# Patient Record
Sex: Female | Born: 1935 | Race: Asian | Marital: Married | Smoking: Former smoker
Health system: Southern US, Community
[De-identification: ages and names within clinical notes are randomized; demographics above are authoritative.]

## PROBLEM LIST (undated history)

## (undated) DIAGNOSIS — I509 Heart failure, unspecified: Secondary | ICD-10-CM

## (undated) DIAGNOSIS — I10 Essential (primary) hypertension: Secondary | ICD-10-CM

## (undated) DIAGNOSIS — G8929 Other chronic pain: Secondary | ICD-10-CM

## (undated) DIAGNOSIS — J45909 Unspecified asthma, uncomplicated: Secondary | ICD-10-CM

## (undated) DIAGNOSIS — J309 Allergic rhinitis, unspecified: Secondary | ICD-10-CM

## (undated) DIAGNOSIS — I313 Pericardial effusion (noninflammatory): Secondary | ICD-10-CM

## (undated) DIAGNOSIS — G4730 Sleep apnea, unspecified: Secondary | ICD-10-CM

## (undated) DIAGNOSIS — M810 Age-related osteoporosis without current pathological fracture: Secondary | ICD-10-CM

## (undated) DIAGNOSIS — E785 Hyperlipidemia, unspecified: Secondary | ICD-10-CM

## (undated) HISTORY — DX: Essential (primary) hypertension: I10

## (undated) HISTORY — DX: Sleep apnea, unspecified: G47.30

## (undated) HISTORY — DX: Unspecified asthma, uncomplicated: J45.909

## (undated) HISTORY — DX: Allergic rhinitis, unspecified: J30.9

## (undated) HISTORY — DX: Hyperlipidemia, unspecified: E78.5

## (undated) HISTORY — PX: BACK SURGERY: SHX140

## (undated) HISTORY — DX: Age-related osteoporosis without current pathological fracture: M81.0

---
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
## 2010-10-20 DIAGNOSIS — R5383 Other fatigue: Secondary | ICD-10-CM | POA: Insufficient documentation

## 2010-11-28 DIAGNOSIS — M549 Dorsalgia, unspecified: Secondary | ICD-10-CM | POA: Insufficient documentation

## 2011-07-18 HISTORY — PX: PERICARDIAL WINDOW: SHX2213

## 2012-01-17 HISTORY — PX: CARDIAC SURGERY: SHX584

## 2015-09-23 DIAGNOSIS — R0989 Other specified symptoms and signs involving the circulatory and respiratory systems: Secondary | ICD-10-CM | POA: Insufficient documentation

## 2016-02-07 DIAGNOSIS — R6889 Other general symptoms and signs: Secondary | ICD-10-CM | POA: Diagnosis not present

## 2016-02-07 DIAGNOSIS — M1990 Unspecified osteoarthritis, unspecified site: Secondary | ICD-10-CM | POA: Diagnosis not present

## 2016-02-07 DIAGNOSIS — F5101 Primary insomnia: Secondary | ICD-10-CM | POA: Diagnosis not present

## 2016-02-08 DIAGNOSIS — M5417 Radiculopathy, lumbosacral region: Secondary | ICD-10-CM | POA: Diagnosis not present

## 2016-02-08 DIAGNOSIS — M4807 Spinal stenosis, lumbosacral region: Secondary | ICD-10-CM | POA: Diagnosis not present

## 2016-04-10 DIAGNOSIS — G4700 Insomnia, unspecified: Secondary | ICD-10-CM | POA: Diagnosis not present

## 2016-04-10 DIAGNOSIS — G4733 Obstructive sleep apnea (adult) (pediatric): Secondary | ICD-10-CM | POA: Diagnosis not present

## 2016-05-03 DIAGNOSIS — E785 Hyperlipidemia, unspecified: Secondary | ICD-10-CM | POA: Diagnosis not present

## 2016-05-03 DIAGNOSIS — I10 Essential (primary) hypertension: Secondary | ICD-10-CM | POA: Diagnosis not present

## 2016-05-03 DIAGNOSIS — R0600 Dyspnea, unspecified: Secondary | ICD-10-CM | POA: Diagnosis not present

## 2016-05-08 DIAGNOSIS — E782 Mixed hyperlipidemia: Secondary | ICD-10-CM | POA: Diagnosis not present

## 2016-05-08 DIAGNOSIS — J309 Allergic rhinitis, unspecified: Secondary | ICD-10-CM | POA: Diagnosis not present

## 2016-05-08 DIAGNOSIS — F5101 Primary insomnia: Secondary | ICD-10-CM | POA: Diagnosis not present

## 2016-05-08 DIAGNOSIS — M5030 Other cervical disc degeneration, unspecified cervical region: Secondary | ICD-10-CM | POA: Diagnosis not present

## 2016-05-08 DIAGNOSIS — J45909 Unspecified asthma, uncomplicated: Secondary | ICD-10-CM | POA: Diagnosis not present

## 2016-05-08 DIAGNOSIS — E559 Vitamin D deficiency, unspecified: Secondary | ICD-10-CM | POA: Diagnosis not present

## 2016-05-08 DIAGNOSIS — L299 Pruritus, unspecified: Secondary | ICD-10-CM | POA: Diagnosis not present

## 2016-05-08 DIAGNOSIS — M810 Age-related osteoporosis without current pathological fracture: Secondary | ICD-10-CM | POA: Diagnosis not present

## 2016-05-08 DIAGNOSIS — R351 Nocturia: Secondary | ICD-10-CM | POA: Diagnosis not present

## 2016-05-08 DIAGNOSIS — G4733 Obstructive sleep apnea (adult) (pediatric): Secondary | ICD-10-CM | POA: Diagnosis not present

## 2016-07-11 DIAGNOSIS — E789 Disorder of lipoprotein metabolism, unspecified: Secondary | ICD-10-CM | POA: Diagnosis not present

## 2016-07-11 DIAGNOSIS — I313 Pericardial effusion (noninflammatory): Secondary | ICD-10-CM | POA: Diagnosis not present

## 2016-07-11 DIAGNOSIS — I10 Essential (primary) hypertension: Secondary | ICD-10-CM | POA: Diagnosis not present

## 2016-07-11 DIAGNOSIS — Z6827 Body mass index (BMI) 27.0-27.9, adult: Secondary | ICD-10-CM | POA: Diagnosis not present

## 2016-07-11 DIAGNOSIS — R5382 Chronic fatigue, unspecified: Secondary | ICD-10-CM | POA: Diagnosis not present

## 2016-07-24 DIAGNOSIS — R5383 Other fatigue: Secondary | ICD-10-CM | POA: Diagnosis not present

## 2016-07-24 DIAGNOSIS — I10 Essential (primary) hypertension: Secondary | ICD-10-CM | POA: Diagnosis not present

## 2016-07-24 DIAGNOSIS — I361 Nonrheumatic tricuspid (valve) insufficiency: Secondary | ICD-10-CM | POA: Diagnosis not present

## 2016-10-09 DIAGNOSIS — M79604 Pain in right leg: Secondary | ICD-10-CM | POA: Diagnosis not present

## 2016-10-09 DIAGNOSIS — R609 Edema, unspecified: Secondary | ICD-10-CM | POA: Diagnosis not present

## 2016-10-09 DIAGNOSIS — E785 Hyperlipidemia, unspecified: Secondary | ICD-10-CM | POA: Diagnosis not present

## 2016-10-09 DIAGNOSIS — E663 Overweight: Secondary | ICD-10-CM | POA: Diagnosis not present

## 2016-10-09 DIAGNOSIS — Z131 Encounter for screening for diabetes mellitus: Secondary | ICD-10-CM | POA: Diagnosis not present

## 2016-10-09 DIAGNOSIS — M79672 Pain in left foot: Secondary | ICD-10-CM | POA: Diagnosis not present

## 2016-10-09 DIAGNOSIS — M79605 Pain in left leg: Secondary | ICD-10-CM | POA: Diagnosis not present

## 2016-10-09 DIAGNOSIS — I10 Essential (primary) hypertension: Secondary | ICD-10-CM | POA: Diagnosis not present

## 2016-10-09 DIAGNOSIS — F329 Major depressive disorder, single episode, unspecified: Secondary | ICD-10-CM | POA: Diagnosis not present

## 2016-10-10 ENCOUNTER — Other Ambulatory Visit: Payer: Self-pay | Admitting: Physician Assistant

## 2016-10-10 DIAGNOSIS — R609 Edema, unspecified: Secondary | ICD-10-CM

## 2016-10-10 DIAGNOSIS — M79661 Pain in right lower leg: Secondary | ICD-10-CM

## 2016-10-10 DIAGNOSIS — M79662 Pain in left lower leg: Principal | ICD-10-CM

## 2016-10-18 ENCOUNTER — Ambulatory Visit
Admission: RE | Admit: 2016-10-18 | Discharge: 2016-10-18 | Disposition: A | Discharge status: Home / Self Care | Payer: Medicare Other | Source: Ambulatory Visit | Attending: Physician Assistant | Admitting: Physician Assistant

## 2016-10-18 DIAGNOSIS — M79661 Pain in right lower leg: Secondary | ICD-10-CM

## 2016-10-18 DIAGNOSIS — M7989 Other specified soft tissue disorders: Secondary | ICD-10-CM | POA: Diagnosis not present

## 2016-10-18 DIAGNOSIS — R609 Edema, unspecified: Secondary | ICD-10-CM

## 2016-10-18 DIAGNOSIS — M79662 Pain in left lower leg: Secondary | ICD-10-CM | POA: Diagnosis not present

## 2016-10-25 DIAGNOSIS — E663 Overweight: Secondary | ICD-10-CM | POA: Diagnosis not present

## 2016-10-25 DIAGNOSIS — R7303 Prediabetes: Secondary | ICD-10-CM | POA: Diagnosis not present

## 2016-10-25 DIAGNOSIS — E559 Vitamin D deficiency, unspecified: Secondary | ICD-10-CM | POA: Diagnosis not present

## 2016-10-25 DIAGNOSIS — R05 Cough: Secondary | ICD-10-CM | POA: Diagnosis not present

## 2016-10-25 DIAGNOSIS — F329 Major depressive disorder, single episode, unspecified: Secondary | ICD-10-CM | POA: Diagnosis not present

## 2016-10-25 DIAGNOSIS — E785 Hyperlipidemia, unspecified: Secondary | ICD-10-CM | POA: Diagnosis not present

## 2016-10-25 DIAGNOSIS — I10 Essential (primary) hypertension: Secondary | ICD-10-CM | POA: Diagnosis not present

## 2016-10-25 DIAGNOSIS — R609 Edema, unspecified: Secondary | ICD-10-CM | POA: Diagnosis not present

## 2016-11-03 DIAGNOSIS — R0609 Other forms of dyspnea: Secondary | ICD-10-CM | POA: Diagnosis not present

## 2016-11-03 DIAGNOSIS — I10 Essential (primary) hypertension: Secondary | ICD-10-CM | POA: Diagnosis not present

## 2016-11-11 DIAGNOSIS — Z23 Encounter for immunization: Secondary | ICD-10-CM | POA: Diagnosis not present

## 2016-11-16 DIAGNOSIS — I10 Essential (primary) hypertension: Secondary | ICD-10-CM | POA: Diagnosis not present

## 2016-11-16 DIAGNOSIS — R05 Cough: Secondary | ICD-10-CM | POA: Diagnosis not present

## 2016-11-16 DIAGNOSIS — E782 Mixed hyperlipidemia: Secondary | ICD-10-CM | POA: Diagnosis not present

## 2016-11-16 DIAGNOSIS — J029 Acute pharyngitis, unspecified: Secondary | ICD-10-CM | POA: Diagnosis not present

## 2016-11-16 DIAGNOSIS — J45909 Unspecified asthma, uncomplicated: Secondary | ICD-10-CM | POA: Diagnosis not present

## 2016-11-16 DIAGNOSIS — M810 Age-related osteoporosis without current pathological fracture: Secondary | ICD-10-CM | POA: Diagnosis not present

## 2016-11-20 DIAGNOSIS — M4727 Other spondylosis with radiculopathy, lumbosacral region: Secondary | ICD-10-CM | POA: Diagnosis not present

## 2016-11-21 DIAGNOSIS — R600 Localized edema: Secondary | ICD-10-CM | POA: Diagnosis not present

## 2016-11-21 DIAGNOSIS — E782 Mixed hyperlipidemia: Secondary | ICD-10-CM | POA: Diagnosis not present

## 2016-11-29 DIAGNOSIS — E559 Vitamin D deficiency, unspecified: Secondary | ICD-10-CM | POA: Diagnosis not present

## 2016-11-29 DIAGNOSIS — I10 Essential (primary) hypertension: Secondary | ICD-10-CM | POA: Diagnosis not present

## 2016-11-29 DIAGNOSIS — F329 Major depressive disorder, single episode, unspecified: Secondary | ICD-10-CM | POA: Diagnosis not present

## 2016-11-29 DIAGNOSIS — R609 Edema, unspecified: Secondary | ICD-10-CM | POA: Diagnosis not present

## 2016-11-29 DIAGNOSIS — E663 Overweight: Secondary | ICD-10-CM | POA: Diagnosis not present

## 2016-11-29 DIAGNOSIS — E785 Hyperlipidemia, unspecified: Secondary | ICD-10-CM | POA: Diagnosis not present

## 2016-11-29 DIAGNOSIS — R7303 Prediabetes: Secondary | ICD-10-CM | POA: Diagnosis not present

## 2017-01-26 DIAGNOSIS — R7303 Prediabetes: Secondary | ICD-10-CM | POA: Diagnosis not present

## 2017-01-26 DIAGNOSIS — E663 Overweight: Secondary | ICD-10-CM | POA: Diagnosis not present

## 2017-01-26 DIAGNOSIS — E785 Hyperlipidemia, unspecified: Secondary | ICD-10-CM | POA: Diagnosis not present

## 2017-01-26 DIAGNOSIS — I10 Essential (primary) hypertension: Secondary | ICD-10-CM | POA: Diagnosis not present

## 2017-01-26 DIAGNOSIS — R05 Cough: Secondary | ICD-10-CM | POA: Diagnosis not present

## 2017-01-26 DIAGNOSIS — F329 Major depressive disorder, single episode, unspecified: Secondary | ICD-10-CM | POA: Diagnosis not present

## 2017-01-26 DIAGNOSIS — E559 Vitamin D deficiency, unspecified: Secondary | ICD-10-CM | POA: Diagnosis not present

## 2017-02-20 DIAGNOSIS — R05 Cough: Secondary | ICD-10-CM | POA: Diagnosis not present

## 2017-02-20 DIAGNOSIS — E782 Mixed hyperlipidemia: Secondary | ICD-10-CM | POA: Diagnosis not present

## 2017-02-20 DIAGNOSIS — E559 Vitamin D deficiency, unspecified: Secondary | ICD-10-CM | POA: Diagnosis not present

## 2017-02-20 DIAGNOSIS — J069 Acute upper respiratory infection, unspecified: Secondary | ICD-10-CM | POA: Diagnosis not present

## 2017-02-20 DIAGNOSIS — L299 Pruritus, unspecified: Secondary | ICD-10-CM | POA: Diagnosis not present

## 2017-03-07 DIAGNOSIS — R252 Cramp and spasm: Secondary | ICD-10-CM | POA: Diagnosis not present

## 2017-03-07 DIAGNOSIS — R7303 Prediabetes: Secondary | ICD-10-CM | POA: Diagnosis not present

## 2017-03-07 DIAGNOSIS — E663 Overweight: Secondary | ICD-10-CM | POA: Diagnosis not present

## 2017-03-07 DIAGNOSIS — F329 Major depressive disorder, single episode, unspecified: Secondary | ICD-10-CM | POA: Diagnosis not present

## 2017-03-07 DIAGNOSIS — I10 Essential (primary) hypertension: Secondary | ICD-10-CM | POA: Diagnosis not present

## 2017-03-07 DIAGNOSIS — E785 Hyperlipidemia, unspecified: Secondary | ICD-10-CM | POA: Diagnosis not present

## 2017-03-07 DIAGNOSIS — E559 Vitamin D deficiency, unspecified: Secondary | ICD-10-CM | POA: Diagnosis not present

## 2017-04-12 DIAGNOSIS — G4700 Insomnia, unspecified: Secondary | ICD-10-CM | POA: Diagnosis not present

## 2017-04-12 DIAGNOSIS — G4733 Obstructive sleep apnea (adult) (pediatric): Secondary | ICD-10-CM | POA: Diagnosis not present

## 2017-05-11 DIAGNOSIS — J069 Acute upper respiratory infection, unspecified: Secondary | ICD-10-CM | POA: Diagnosis not present

## 2017-05-11 DIAGNOSIS — R7303 Prediabetes: Secondary | ICD-10-CM | POA: Diagnosis not present

## 2017-05-11 DIAGNOSIS — E785 Hyperlipidemia, unspecified: Secondary | ICD-10-CM | POA: Diagnosis not present

## 2017-05-11 DIAGNOSIS — E559 Vitamin D deficiency, unspecified: Secondary | ICD-10-CM | POA: Diagnosis not present

## 2017-05-11 DIAGNOSIS — F329 Major depressive disorder, single episode, unspecified: Secondary | ICD-10-CM | POA: Diagnosis not present

## 2017-05-11 DIAGNOSIS — R252 Cramp and spasm: Secondary | ICD-10-CM | POA: Diagnosis not present

## 2017-05-11 DIAGNOSIS — I10 Essential (primary) hypertension: Secondary | ICD-10-CM | POA: Diagnosis not present

## 2017-05-11 DIAGNOSIS — E663 Overweight: Secondary | ICD-10-CM | POA: Diagnosis not present

## 2017-05-17 DIAGNOSIS — G4733 Obstructive sleep apnea (adult) (pediatric): Secondary | ICD-10-CM | POA: Diagnosis not present

## 2017-05-17 DIAGNOSIS — E789 Disorder of lipoprotein metabolism, unspecified: Secondary | ICD-10-CM | POA: Diagnosis not present

## 2017-05-17 DIAGNOSIS — R0789 Other chest pain: Secondary | ICD-10-CM | POA: Diagnosis not present

## 2017-05-17 DIAGNOSIS — Z6826 Body mass index (BMI) 26.0-26.9, adult: Secondary | ICD-10-CM | POA: Diagnosis not present

## 2017-05-17 DIAGNOSIS — I10 Essential (primary) hypertension: Secondary | ICD-10-CM | POA: Diagnosis not present

## 2017-05-17 DIAGNOSIS — R5382 Chronic fatigue, unspecified: Secondary | ICD-10-CM | POA: Diagnosis not present

## 2017-05-17 DIAGNOSIS — R05 Cough: Secondary | ICD-10-CM | POA: Diagnosis not present

## 2017-05-17 DIAGNOSIS — R0602 Shortness of breath: Secondary | ICD-10-CM | POA: Diagnosis not present

## 2017-05-18 DIAGNOSIS — G4733 Obstructive sleep apnea (adult) (pediatric): Secondary | ICD-10-CM | POA: Diagnosis not present

## 2017-05-24 DIAGNOSIS — G2581 Restless legs syndrome: Secondary | ICD-10-CM | POA: Diagnosis not present

## 2017-05-24 DIAGNOSIS — J209 Acute bronchitis, unspecified: Secondary | ICD-10-CM | POA: Diagnosis not present

## 2017-05-24 DIAGNOSIS — E782 Mixed hyperlipidemia: Secondary | ICD-10-CM | POA: Diagnosis not present

## 2017-05-24 DIAGNOSIS — E870 Hyperosmolality and hypernatremia: Secondary | ICD-10-CM | POA: Diagnosis not present

## 2017-05-24 DIAGNOSIS — G4700 Insomnia, unspecified: Secondary | ICD-10-CM | POA: Diagnosis not present

## 2017-05-24 DIAGNOSIS — E559 Vitamin D deficiency, unspecified: Secondary | ICD-10-CM | POA: Diagnosis not present

## 2017-05-24 DIAGNOSIS — R8290 Unspecified abnormal findings in urine: Secondary | ICD-10-CM | POA: Diagnosis not present

## 2017-05-24 DIAGNOSIS — R718 Other abnormality of red blood cells: Secondary | ICD-10-CM | POA: Diagnosis not present

## 2017-05-24 DIAGNOSIS — D7589 Other specified diseases of blood and blood-forming organs: Secondary | ICD-10-CM | POA: Diagnosis not present

## 2017-05-24 DIAGNOSIS — I10 Essential (primary) hypertension: Secondary | ICD-10-CM | POA: Diagnosis not present

## 2017-05-30 DIAGNOSIS — G2581 Restless legs syndrome: Secondary | ICD-10-CM | POA: Insufficient documentation

## 2017-06-19 DIAGNOSIS — G4700 Insomnia, unspecified: Secondary | ICD-10-CM | POA: Diagnosis not present

## 2017-06-19 DIAGNOSIS — R5382 Chronic fatigue, unspecified: Secondary | ICD-10-CM | POA: Diagnosis not present

## 2017-06-19 DIAGNOSIS — Z6824 Body mass index (BMI) 24.0-24.9, adult: Secondary | ICD-10-CM | POA: Diagnosis not present

## 2017-06-19 DIAGNOSIS — E789 Disorder of lipoprotein metabolism, unspecified: Secondary | ICD-10-CM | POA: Diagnosis not present

## 2017-06-19 DIAGNOSIS — I10 Essential (primary) hypertension: Secondary | ICD-10-CM | POA: Diagnosis not present

## 2017-09-03 DIAGNOSIS — E782 Mixed hyperlipidemia: Secondary | ICD-10-CM | POA: Diagnosis not present

## 2017-09-03 DIAGNOSIS — E559 Vitamin D deficiency, unspecified: Secondary | ICD-10-CM | POA: Diagnosis not present

## 2017-09-03 DIAGNOSIS — G4733 Obstructive sleep apnea (adult) (pediatric): Secondary | ICD-10-CM | POA: Diagnosis not present

## 2017-09-03 DIAGNOSIS — I10 Essential (primary) hypertension: Secondary | ICD-10-CM | POA: Diagnosis not present

## 2017-09-03 DIAGNOSIS — R252 Cramp and spasm: Secondary | ICD-10-CM | POA: Diagnosis not present

## 2017-09-03 DIAGNOSIS — J4520 Mild intermittent asthma, uncomplicated: Secondary | ICD-10-CM | POA: Diagnosis not present

## 2017-09-03 DIAGNOSIS — G4709 Other insomnia: Secondary | ICD-10-CM | POA: Diagnosis not present

## 2017-09-11 ENCOUNTER — Other Ambulatory Visit: Payer: Self-pay | Admitting: Internal Medicine

## 2017-09-11 DIAGNOSIS — E2839 Other primary ovarian failure: Secondary | ICD-10-CM

## 2017-09-12 DIAGNOSIS — E785 Hyperlipidemia, unspecified: Secondary | ICD-10-CM | POA: Insufficient documentation

## 2017-09-12 DIAGNOSIS — L299 Pruritus, unspecified: Secondary | ICD-10-CM | POA: Insufficient documentation

## 2017-09-12 DIAGNOSIS — F5101 Primary insomnia: Secondary | ICD-10-CM | POA: Insufficient documentation

## 2017-09-12 DIAGNOSIS — R351 Nocturia: Secondary | ICD-10-CM | POA: Insufficient documentation

## 2017-09-12 DIAGNOSIS — M5030 Other cervical disc degeneration, unspecified cervical region: Secondary | ICD-10-CM | POA: Insufficient documentation

## 2017-09-12 DIAGNOSIS — E559 Vitamin D deficiency, unspecified: Secondary | ICD-10-CM | POA: Insufficient documentation

## 2017-09-12 DIAGNOSIS — G4733 Obstructive sleep apnea (adult) (pediatric): Secondary | ICD-10-CM | POA: Insufficient documentation

## 2017-09-12 DIAGNOSIS — J309 Allergic rhinitis, unspecified: Secondary | ICD-10-CM | POA: Insufficient documentation

## 2017-09-12 DIAGNOSIS — M810 Age-related osteoporosis without current pathological fracture: Secondary | ICD-10-CM | POA: Insufficient documentation

## 2017-09-12 DIAGNOSIS — J45909 Unspecified asthma, uncomplicated: Secondary | ICD-10-CM | POA: Insufficient documentation

## 2017-10-09 DIAGNOSIS — G2581 Restless legs syndrome: Secondary | ICD-10-CM | POA: Diagnosis not present

## 2017-10-09 DIAGNOSIS — R252 Cramp and spasm: Secondary | ICD-10-CM | POA: Diagnosis not present

## 2017-10-09 DIAGNOSIS — Z0001 Encounter for general adult medical examination with abnormal findings: Secondary | ICD-10-CM | POA: Diagnosis not present

## 2017-10-09 DIAGNOSIS — E559 Vitamin D deficiency, unspecified: Secondary | ICD-10-CM | POA: Diagnosis not present

## 2017-10-09 DIAGNOSIS — E782 Mixed hyperlipidemia: Secondary | ICD-10-CM | POA: Diagnosis not present

## 2017-10-15 DIAGNOSIS — N183 Chronic kidney disease, stage 3 unspecified: Secondary | ICD-10-CM | POA: Insufficient documentation

## 2017-10-15 DIAGNOSIS — D7589 Other specified diseases of blood and blood-forming organs: Secondary | ICD-10-CM | POA: Diagnosis not present

## 2017-10-15 DIAGNOSIS — Z13820 Encounter for screening for osteoporosis: Secondary | ICD-10-CM | POA: Diagnosis not present

## 2017-10-15 DIAGNOSIS — E782 Mixed hyperlipidemia: Secondary | ICD-10-CM | POA: Diagnosis not present

## 2017-10-15 DIAGNOSIS — Z0001 Encounter for general adult medical examination with abnormal findings: Secondary | ICD-10-CM | POA: Diagnosis not present

## 2017-10-15 DIAGNOSIS — I10 Essential (primary) hypertension: Secondary | ICD-10-CM | POA: Diagnosis not present

## 2017-10-15 DIAGNOSIS — E559 Vitamin D deficiency, unspecified: Secondary | ICD-10-CM | POA: Diagnosis not present

## 2017-10-15 DIAGNOSIS — R252 Cramp and spasm: Secondary | ICD-10-CM | POA: Diagnosis not present

## 2017-10-15 DIAGNOSIS — Z23 Encounter for immunization: Secondary | ICD-10-CM | POA: Diagnosis not present

## 2017-10-15 DIAGNOSIS — Z1239 Encounter for other screening for malignant neoplasm of breast: Secondary | ICD-10-CM | POA: Diagnosis not present

## 2017-10-15 DIAGNOSIS — N289 Disorder of kidney and ureter, unspecified: Secondary | ICD-10-CM | POA: Diagnosis not present

## 2017-10-18 ENCOUNTER — Other Ambulatory Visit: Payer: Self-pay | Admitting: Internal Medicine

## 2017-10-18 DIAGNOSIS — Z1231 Encounter for screening mammogram for malignant neoplasm of breast: Secondary | ICD-10-CM

## 2017-10-18 DIAGNOSIS — Z1239 Encounter for other screening for malignant neoplasm of breast: Secondary | ICD-10-CM

## 2017-11-06 ENCOUNTER — Inpatient Hospital Stay
Admission: RE | Admit: 2017-11-06 | Discharge: 2017-11-06 | Disposition: A | Discharge status: Home / Self Care | Payer: Medicare Other | Source: Ambulatory Visit | Attending: Internal Medicine | Admitting: Internal Medicine

## 2017-12-27 DIAGNOSIS — M62838 Other muscle spasm: Secondary | ICD-10-CM | POA: Diagnosis not present

## 2017-12-27 DIAGNOSIS — Z6825 Body mass index (BMI) 25.0-25.9, adult: Secondary | ICD-10-CM | POA: Diagnosis not present

## 2017-12-27 DIAGNOSIS — I10 Essential (primary) hypertension: Secondary | ICD-10-CM | POA: Diagnosis not present

## 2017-12-27 DIAGNOSIS — E789 Disorder of lipoprotein metabolism, unspecified: Secondary | ICD-10-CM | POA: Diagnosis not present

## 2017-12-27 DIAGNOSIS — R5382 Chronic fatigue, unspecified: Secondary | ICD-10-CM | POA: Diagnosis not present

## 2017-12-31 DIAGNOSIS — J158 Pneumonia due to other specified bacteria: Secondary | ICD-10-CM | POA: Diagnosis not present

## 2018-01-14 ENCOUNTER — Other Ambulatory Visit: Payer: Self-pay | Admitting: Internal Medicine

## 2018-01-14 ENCOUNTER — Ambulatory Visit
Admission: RE | Admit: 2018-01-14 | Discharge: 2018-01-14 | Disposition: A | Discharge status: Home / Self Care | Payer: Medicare Other | Source: Ambulatory Visit | Attending: Internal Medicine | Admitting: Internal Medicine

## 2018-01-14 DIAGNOSIS — G4709 Other insomnia: Secondary | ICD-10-CM | POA: Diagnosis not present

## 2018-01-14 DIAGNOSIS — R05 Cough: Secondary | ICD-10-CM

## 2018-01-14 DIAGNOSIS — R252 Cramp and spasm: Secondary | ICD-10-CM | POA: Diagnosis not present

## 2018-01-14 DIAGNOSIS — R059 Cough, unspecified: Secondary | ICD-10-CM

## 2018-01-14 DIAGNOSIS — I10 Essential (primary) hypertension: Secondary | ICD-10-CM | POA: Diagnosis not present

## 2018-01-14 DIAGNOSIS — R0989 Other specified symptoms and signs involving the circulatory and respiratory systems: Secondary | ICD-10-CM | POA: Diagnosis not present

## 2018-02-18 DIAGNOSIS — R05 Cough: Secondary | ICD-10-CM | POA: Diagnosis not present

## 2018-02-18 DIAGNOSIS — N183 Chronic kidney disease, stage 3 (moderate): Secondary | ICD-10-CM | POA: Diagnosis not present

## 2018-02-18 DIAGNOSIS — R252 Cramp and spasm: Secondary | ICD-10-CM | POA: Diagnosis not present

## 2018-02-18 DIAGNOSIS — G4709 Other insomnia: Secondary | ICD-10-CM | POA: Diagnosis not present

## 2018-02-18 DIAGNOSIS — M545 Low back pain: Secondary | ICD-10-CM | POA: Diagnosis not present

## 2018-05-20 DIAGNOSIS — I3139 Other pericardial effusion (noninflammatory): Secondary | ICD-10-CM

## 2018-05-20 DIAGNOSIS — E559 Vitamin D deficiency, unspecified: Secondary | ICD-10-CM | POA: Diagnosis not present

## 2018-05-20 DIAGNOSIS — J309 Allergic rhinitis, unspecified: Secondary | ICD-10-CM | POA: Diagnosis not present

## 2018-05-20 DIAGNOSIS — I313 Pericardial effusion (noninflammatory): Secondary | ICD-10-CM

## 2018-05-20 DIAGNOSIS — J45909 Unspecified asthma, uncomplicated: Secondary | ICD-10-CM | POA: Diagnosis not present

## 2018-05-20 DIAGNOSIS — G4733 Obstructive sleep apnea (adult) (pediatric): Secondary | ICD-10-CM | POA: Diagnosis not present

## 2018-05-20 DIAGNOSIS — F5101 Primary insomnia: Secondary | ICD-10-CM | POA: Diagnosis not present

## 2018-05-20 DIAGNOSIS — I10 Essential (primary) hypertension: Secondary | ICD-10-CM | POA: Diagnosis not present

## 2018-05-20 DIAGNOSIS — E782 Mixed hyperlipidemia: Secondary | ICD-10-CM | POA: Diagnosis not present

## 2018-05-20 DIAGNOSIS — Z1239 Encounter for other screening for malignant neoplasm of breast: Secondary | ICD-10-CM | POA: Diagnosis not present

## 2018-05-20 DIAGNOSIS — N183 Chronic kidney disease, stage 3 (moderate): Secondary | ICD-10-CM | POA: Diagnosis not present

## 2018-05-20 DIAGNOSIS — M810 Age-related osteoporosis without current pathological fracture: Secondary | ICD-10-CM | POA: Diagnosis not present

## 2018-05-20 HISTORY — DX: Pericardial effusion (noninflammatory): I31.3

## 2018-05-20 HISTORY — DX: Other pericardial effusion (noninflammatory): I31.39

## 2018-05-27 ENCOUNTER — Other Ambulatory Visit: Payer: Self-pay | Admitting: Internal Medicine

## 2018-05-27 DIAGNOSIS — M810 Age-related osteoporosis without current pathological fracture: Secondary | ICD-10-CM

## 2018-06-20 DIAGNOSIS — R05 Cough: Secondary | ICD-10-CM | POA: Diagnosis not present

## 2018-06-20 DIAGNOSIS — F5104 Psychophysiologic insomnia: Secondary | ICD-10-CM | POA: Diagnosis not present

## 2018-06-20 DIAGNOSIS — J209 Acute bronchitis, unspecified: Secondary | ICD-10-CM | POA: Diagnosis not present

## 2018-06-24 ENCOUNTER — Other Ambulatory Visit: Payer: Self-pay

## 2018-06-24 ENCOUNTER — Ambulatory Visit (INDEPENDENT_AMBULATORY_CARE_PROVIDER_SITE_OTHER): Payer: Medicare Other | Admitting: Internal Medicine

## 2018-06-24 ENCOUNTER — Encounter: Payer: Self-pay | Admitting: Internal Medicine

## 2018-06-24 VITALS — BP 112/60 | HR 79 | Temp 97.8°F | Ht 69.0 in | Wt 135.0 lb

## 2018-06-24 DIAGNOSIS — I10 Essential (primary) hypertension: Secondary | ICD-10-CM | POA: Insufficient documentation

## 2018-06-24 DIAGNOSIS — G2581 Restless legs syndrome: Secondary | ICD-10-CM | POA: Diagnosis not present

## 2018-06-24 DIAGNOSIS — G4733 Obstructive sleep apnea (adult) (pediatric): Secondary | ICD-10-CM

## 2018-06-24 MED ORDER — DOXEPIN HCL 10 MG PO CAPS: ORAL_CAPSULE | ORAL | 5 refills | Status: AC

## 2018-06-24 NOTE — Patient Instructions (Signed)
Script sent refilling doxepin 10 mg for sleep  We will contact your previous sleep doctors for the results of the first sleep test.  We will arrange for a local home care company to help take care of your CPAP machine. They will be able to provide new masks, hoses, filters and other supplies as needed.   Please contact us if we can help

## 2018-06-24 NOTE — Assessment & Plan Note (Signed)
Mild OSA.  She seems to benefit from CPAP and to be compliant, though we don't have a download. Using fixed pressure AirSense 10. Plan- we will establish her with a local DME for supplies and support, then obtain download.

## 2018-06-24 NOTE — Progress Notes (Signed)
06/24/2018- 7 yoF for sleep evaluation, [place]- son is interpreter- referred by PCP Dr Maia Petties, OSA on CPAP, uses every night   Medical problem list includes pericardial effusion, HBP, OSA, Restlesss Legs, Nocturia, Asthma, allergic rhinitis, osteoporosis, DDD, CKD3, Pruritus Communication limited- son helpful but his English not good. Advise interpreter in future. She has been successfully using CPAP for about 5 years. Had update NPSG in [place] in 2019  NPSG 05/17/17 ( filed in [employer]) AHI 5.3/ hr, PLMs 236.5/ hr w arousals, desaturation to 80% w average sat 94%. CPAP 7 was recommended, body weight 137 lbs. CPAP was apparently provided through the sleep program associated with a pulmonary office in [place] and she needs a local DME established.  Son indicates she is happy with her CPAP. Using an AirSense 10 machine. She asks refill Doxepin 10 mg, used for sleep and leg cramps. Also has cyclobenzaprine.  Prior to Admission medications   Medication Sig Start Date End Date Taking? Authorizing Provider  albuterol (PROAIR HFA) 108 (90 Base) MCG/ACT inhaler ProAir HFA 90 mcg/actuation aerosol inhaler  Inhale 2 puffs every 4 hours by inhalation route as needed. 05/14/14  Yes [provider]  atorvastatin (LIPITOR) 80 MG tablet atorvastatin 80 mg tablet  use one-half daily in the evenings. 06/19/17  Yes [provider]  cholecalciferol (VITAMIN D3) 25 MCG (1000 UT) tablet Take 1,000 Units by mouth daily.   Yes [provider]  cyclobenzaprine (FLEXERIL) 5 MG tablet cyclobenzaprine 5 mg tablet  Take 1.5 tablets as needed by oral route at bedtime for 30 days.  for muscle cramps ; may use up to 2 x a day as needed   Yes [provider]  doxepin (SINEQUAN) 10 MG capsule 1 at bedtime for leg cramps and sleep 06/24/18  Yes Baird Lyons D, MD  fluticasone [facility]) 50 MCG/ACT nasal spray fluticasone propionate 50 mcg/actuation nasal spray,suspension  as needed for seasonal  allergies 05/14/14  Yes [provider]  losartan (COZAAR) 25 MG tablet losartan 25 mg tablet  Take 1 tablet every day by oral route for 30 days.   Yes [provider]   Past Medical History:  Diagnosis Date  . Allergic rhinitis   . Asthma   . Hyperlipidemia   . Hypertension   . Osteoporosis   . Pericardial effusion 05/20/2018  . Sleep apnea    Past Surgical History:  Procedure Laterality Date  . CARDIAC SURGERY  2014   Unknown type   History reviewed. No pertinent family history. Social History   Socioeconomic History  . Marital status: Unknown    Spouse name: Not on file  . Number of children: Not on file  . Years of education: Not on file  . Highest education level: Not on file  Occupational History  . Not on file  Social Needs  . Financial resource strain: Not on file  . Food insecurity:    Worry: Not on file    Inability: Not on file  . Transportation needs:    Medical: Not on file    Non-medical: Not on file  Tobacco Use  . Smoking status: Never Smoker  . Smokeless tobacco: Never Used  Substance and Sexual Activity  . Alcohol use: Not on file  . Drug use: Not on file  . Sexual activity: Not on file  Lifestyle  . Physical activity:    Days per week: Not on file    Minutes per session: Not on file  . Stress: Not on file  Relationships  . Social connections:    Talks on phone: Not on file    Gets together: Not on file    Attends religious service: Not on file    Active member of club or organization: Not on file    Attends meetings of clubs or organizations: Not on file    Relationship status: Not on file  . Intimate partner violence:    Fear of current or ex partner: Not on file    Emotionally abused: Not on file    Physically abused: Not on file    Forced sexual activity: Not on file  Other Topics Concern  . Not on file  Social History Narrative  . Not on file   ROS-see HPI   + = positive Constitutional:    weight loss, night  sweats, fevers, chills, fatigue, lassitude. HEENT:    headaches, difficulty swallowing, tooth/dental problems, sore throat,       sneezing, itching, ear ache, nasal congestion, post nasal drip, snoring CV:    chest pain, orthopnea, PND, swelling in lower extremities, anasarca,                                  dizziness, palpitations Resp:   shortness of breath with exertion or at rest.                productive cough,   non-productive cough, coughing up of blood.              change in color of mucus.  wheezing.   Skin:    rash or lesions. GI:  No-   heartburn, indigestion, abdominal pain, nausea, vomiting, diarrhea,                 change in bowel habits, loss of appetite GU: dysuria, change in color of urine, no urgency or frequency.   flank pain. MS:   joint pain, stiffness, decreased range of motion, back pain. Neuro-     nothing unusual Psych:  change in mood or affect.  depression or anxiety.   memory loss.  OBJ- Physical Exam General- Alert, Oriented, Affect-appropriate, Distress- none acute, not obese Skin- rash-none, lesions- none, excoriation- none Lymphadenopathy- none Head- atraumatic            Eyes- Gross vision intact, PERRLA, conjunctivae and secretions clear            Ears- Hearing, canals-normal            Nose- Clear, no-Septal dev, mucus, polyps, erosion, perforation             Throat- Mallampati II , mucosa clear , drainage- none, tonsils- atrophic, + some missing teeth Neck- flexible , trachea midline, no stridor , thyroid nl, carotid no bruit Chest - symmetrical excursion , unlabored           Heart/CV- RRR , no murmur , no gallop  , no rub, nl s1 s2                           - JVD- none , edema- none, stasis changes- none, varices- none           Lung- clear to P&A, wheeze- none, cough- none , dullness-none, rub- none           Chest wall-  Abd-  Br/ Gen/ Rectal- Not done, not indicated Extrem- cyanosis- none,  clubbing, none, atrophy- none, strength-  nl Neuro- grossly intact to observation

## 2018-06-24 NOTE — Assessment & Plan Note (Signed)
Limb movement sleep disturbance seems to be a bigger problem for her than sleep apnea. Apparently Doxepin does help and she requests. Carry over sedation is not reported to be a problem. Plan- refill e-sent for Doxepin 10 mg for sleep and leg cramps

## 2018-07-23 DIAGNOSIS — R5382 Chronic fatigue, unspecified: Secondary | ICD-10-CM | POA: Diagnosis not present

## 2018-07-23 DIAGNOSIS — I10 Essential (primary) hypertension: Secondary | ICD-10-CM | POA: Diagnosis not present

## 2018-07-23 DIAGNOSIS — G4700 Insomnia, unspecified: Secondary | ICD-10-CM | POA: Diagnosis not present

## 2018-07-23 DIAGNOSIS — E789 Disorder of lipoprotein metabolism, unspecified: Secondary | ICD-10-CM | POA: Diagnosis not present

## 2018-07-23 DIAGNOSIS — R05 Cough: Secondary | ICD-10-CM | POA: Diagnosis not present

## 2018-08-02 DIAGNOSIS — F5101 Primary insomnia: Secondary | ICD-10-CM | POA: Diagnosis not present

## 2018-08-02 DIAGNOSIS — G4709 Other insomnia: Secondary | ICD-10-CM | POA: Diagnosis not present

## 2018-08-02 DIAGNOSIS — R05 Cough: Secondary | ICD-10-CM | POA: Diagnosis not present

## 2018-08-02 DIAGNOSIS — G2581 Restless legs syndrome: Secondary | ICD-10-CM | POA: Diagnosis not present

## 2018-08-02 DIAGNOSIS — N183 Chronic kidney disease, stage 3 (moderate): Secondary | ICD-10-CM | POA: Diagnosis not present

## 2018-08-02 DIAGNOSIS — I10 Essential (primary) hypertension: Secondary | ICD-10-CM | POA: Diagnosis not present

## 2018-08-02 DIAGNOSIS — R252 Cramp and spasm: Secondary | ICD-10-CM | POA: Diagnosis not present

## 2018-08-02 DIAGNOSIS — J45909 Unspecified asthma, uncomplicated: Secondary | ICD-10-CM | POA: Diagnosis not present

## 2018-08-02 DIAGNOSIS — E782 Mixed hyperlipidemia: Secondary | ICD-10-CM | POA: Diagnosis not present

## 2018-08-02 DIAGNOSIS — J309 Allergic rhinitis, unspecified: Secondary | ICD-10-CM | POA: Diagnosis not present

## 2018-08-02 DIAGNOSIS — G4733 Obstructive sleep apnea (adult) (pediatric): Secondary | ICD-10-CM | POA: Diagnosis not present

## 2018-08-02 DIAGNOSIS — E559 Vitamin D deficiency, unspecified: Secondary | ICD-10-CM | POA: Diagnosis not present

## 2018-08-16 ENCOUNTER — Other Ambulatory Visit: Payer: Self-pay | Admitting: Internal Medicine

## 2018-08-16 DIAGNOSIS — E2839 Other primary ovarian failure: Secondary | ICD-10-CM

## 2018-08-16 DIAGNOSIS — M810 Age-related osteoporosis without current pathological fracture: Secondary | ICD-10-CM

## 2018-08-21 ENCOUNTER — Ambulatory Visit: Payer: Medicare Other

## 2018-08-21 ENCOUNTER — Other Ambulatory Visit: Payer: Medicare Other

## 2018-09-26 DIAGNOSIS — E559 Vitamin D deficiency, unspecified: Secondary | ICD-10-CM | POA: Diagnosis not present

## 2018-09-26 DIAGNOSIS — N183 Chronic kidney disease, stage 3 (moderate): Secondary | ICD-10-CM | POA: Diagnosis not present

## 2018-09-26 DIAGNOSIS — I10 Essential (primary) hypertension: Secondary | ICD-10-CM | POA: Diagnosis not present

## 2018-09-26 DIAGNOSIS — Z131 Encounter for screening for diabetes mellitus: Secondary | ICD-10-CM | POA: Diagnosis not present

## 2018-09-26 DIAGNOSIS — E782 Mixed hyperlipidemia: Secondary | ICD-10-CM | POA: Diagnosis not present

## 2018-09-27 ENCOUNTER — Inpatient Hospital Stay (HOSPITAL_COMMUNITY)
Admission: EM | Admit: 2018-09-27 | Discharge: 2018-09-30 | DRG: 184 | Disposition: A | Discharge status: Home Health | Payer: Medicare Other | Attending: General Surgery | Admitting: General Surgery

## 2018-09-27 ENCOUNTER — Other Ambulatory Visit: Payer: Self-pay

## 2018-09-27 ENCOUNTER — Inpatient Hospital Stay (HOSPITAL_COMMUNITY): Payer: Medicare Other

## 2018-09-27 ENCOUNTER — Emergency Department (HOSPITAL_COMMUNITY): Payer: Medicare Other

## 2018-09-27 ENCOUNTER — Encounter (HOSPITAL_COMMUNITY): Payer: Self-pay | Admitting: Emergency Medicine

## 2018-09-27 DIAGNOSIS — W19XXXA Unspecified fall, initial encounter: Secondary | ICD-10-CM | POA: Diagnosis not present

## 2018-09-27 DIAGNOSIS — W1809XA Striking against other object with subsequent fall, initial encounter: Secondary | ICD-10-CM | POA: Diagnosis present

## 2018-09-27 DIAGNOSIS — Z79899 Other long term (current) drug therapy: Secondary | ICD-10-CM | POA: Diagnosis not present

## 2018-09-27 DIAGNOSIS — N179 Acute kidney failure, unspecified: Secondary | ICD-10-CM | POA: Diagnosis present

## 2018-09-27 DIAGNOSIS — R160 Hepatomegaly, not elsewhere classified: Secondary | ICD-10-CM | POA: Diagnosis present

## 2018-09-27 DIAGNOSIS — M5030 Other cervical disc degeneration, unspecified cervical region: Secondary | ICD-10-CM | POA: Diagnosis present

## 2018-09-27 DIAGNOSIS — J309 Allergic rhinitis, unspecified: Secondary | ICD-10-CM | POA: Diagnosis present

## 2018-09-27 DIAGNOSIS — Z03818 Encounter for observation for suspected exposure to other biological agents ruled out: Secondary | ICD-10-CM | POA: Diagnosis not present

## 2018-09-27 DIAGNOSIS — M810 Age-related osteoporosis without current pathological fracture: Secondary | ICD-10-CM | POA: Diagnosis present

## 2018-09-27 DIAGNOSIS — Z20828 Contact with and (suspected) exposure to other viral communicable diseases: Secondary | ICD-10-CM | POA: Diagnosis present

## 2018-09-27 DIAGNOSIS — S2241XD Multiple fractures of ribs, right side, subsequent encounter for fracture with routine healing: Secondary | ICD-10-CM | POA: Diagnosis not present

## 2018-09-27 DIAGNOSIS — I129 Hypertensive chronic kidney disease with stage 1 through stage 4 chronic kidney disease, or unspecified chronic kidney disease: Secondary | ICD-10-CM | POA: Diagnosis present

## 2018-09-27 DIAGNOSIS — E785 Hyperlipidemia, unspecified: Secondary | ICD-10-CM | POA: Diagnosis present

## 2018-09-27 DIAGNOSIS — I10 Essential (primary) hypertension: Secondary | ICD-10-CM | POA: Diagnosis not present

## 2018-09-27 DIAGNOSIS — K828 Other specified diseases of gallbladder: Secondary | ICD-10-CM | POA: Diagnosis not present

## 2018-09-27 DIAGNOSIS — R52 Pain, unspecified: Secondary | ICD-10-CM | POA: Diagnosis not present

## 2018-09-27 DIAGNOSIS — S2239XA Fracture of one rib, unspecified side, initial encounter for closed fracture: Secondary | ICD-10-CM | POA: Diagnosis not present

## 2018-09-27 DIAGNOSIS — Z08 Encounter for follow-up examination after completed treatment for malignant neoplasm: Secondary | ICD-10-CM

## 2018-09-27 DIAGNOSIS — S3991XA Unspecified injury of abdomen, initial encounter: Secondary | ICD-10-CM | POA: Diagnosis not present

## 2018-09-27 DIAGNOSIS — G4733 Obstructive sleep apnea (adult) (pediatric): Secondary | ICD-10-CM | POA: Diagnosis present

## 2018-09-27 DIAGNOSIS — R109 Unspecified abdominal pain: Secondary | ICD-10-CM | POA: Diagnosis not present

## 2018-09-27 DIAGNOSIS — K829 Disease of gallbladder, unspecified: Secondary | ICD-10-CM | POA: Diagnosis present

## 2018-09-27 DIAGNOSIS — N183 Chronic kidney disease, stage 3 (moderate): Secondary | ICD-10-CM | POA: Diagnosis present

## 2018-09-27 DIAGNOSIS — S2241XA Multiple fractures of ribs, right side, initial encounter for closed fracture: Principal | ICD-10-CM | POA: Diagnosis present

## 2018-09-27 DIAGNOSIS — J90 Pleural effusion, not elsewhere classified: Secondary | ICD-10-CM | POA: Diagnosis not present

## 2018-09-27 DIAGNOSIS — M25511 Pain in right shoulder: Secondary | ICD-10-CM | POA: Diagnosis not present

## 2018-09-27 DIAGNOSIS — M5489 Other dorsalgia: Secondary | ICD-10-CM | POA: Diagnosis not present

## 2018-09-27 HISTORY — DX: Other chronic pain: G89.29

## 2018-09-27 HISTORY — DX: Heart failure, unspecified: I50.9

## 2018-09-27 LAB — CBC
HCT: 38.4 % (ref 36.0–46.0)
Hemoglobin: 12.4 g/dL (ref 12.0–15.0)
MCH: 31.1 pg (ref 26.0–34.0)
MCHC: 32.3 g/dL (ref 30.0–36.0)
MCV: 96.2 fL (ref 80.0–100.0)
Platelets: 243 10*3/uL (ref 150–400)
RBC: 3.99 MIL/uL (ref 3.87–5.11)
RDW: 14.1 % (ref 11.5–15.5)
WBC: 10.4 10*3/uL (ref 4.0–10.5)
nRBC: 0 % (ref 0.0–0.2)

## 2018-09-27 LAB — COMPREHENSIVE METABOLIC PANEL
ALT: 34 U/L (ref 0–44)
AST: 30 U/L (ref 15–41)
Albumin: 4 g/dL (ref 3.5–5.0)
Alkaline Phosphatase: 86 U/L (ref 38–126)
Anion gap: 12 (ref 5–15)
BUN: 17 mg/dL (ref 8–23)
CO2: 27 mmol/L (ref 22–32)
Calcium: 9.1 mg/dL (ref 8.9–10.3)
Chloride: 104 mmol/L (ref 98–111)
Creatinine, Ser: 0.82 mg/dL (ref 0.44–1.00)
GFR calc Af Amer: >60 mL/min (ref >60)
GFR calc non Af Amer: >60 mL/min (ref >60)
Glucose, Bld: 111 mg/dL — ABNORMAL HIGH (ref 70–99)
Potassium: 4.1 mmol/L (ref 3.5–5.1)
Sodium: 143 mmol/L (ref 135–145)
Total Bilirubin: 0.6 mg/dL (ref 0.3–1.2)
Total Protein: 7.5 g/dL (ref 6.5–8.1)

## 2018-09-27 LAB — CBC WITH DIFFERENTIAL/PLATELET
Abs Immature Granulocytes: 0.13 10*3/uL — ABNORMAL HIGH (ref 0.00–0.07)
Basophils Absolute: 0 10*3/uL (ref 0.0–0.1)
Basophils Relative: 0 %
Eosinophils Absolute: 0.1 10*3/uL (ref 0.0–0.5)
Eosinophils Relative: 1 %
HCT: 42.1 % (ref 36.0–46.0)
Hemoglobin: 12.9 g/dL (ref 12.0–15.0)
Immature Granulocytes: 1 %
Lymphocytes Relative: 20 %
Lymphs Abs: 2.8 10*3/uL (ref 0.7–4.0)
MCH: 30.5 pg (ref 26.0–34.0)
MCHC: 30.6 g/dL (ref 30.0–36.0)
MCV: 99.5 fL (ref 80.0–100.0)
Monocytes Absolute: 1.1 10*3/uL — ABNORMAL HIGH (ref 0.1–1.0)
Monocytes Relative: 8 %
Neutro Abs: 9.8 10*3/uL — ABNORMAL HIGH (ref 1.7–7.7)
Neutrophils Relative %: 70 %
Platelets: 281 10*3/uL (ref 150–400)
RBC: 4.23 MIL/uL (ref 3.87–5.11)
RDW: 14.2 % (ref 11.5–15.5)
WBC: 14 10*3/uL — ABNORMAL HIGH (ref 4.0–10.5)
nRBC: 0 % (ref 0.0–0.2)

## 2018-09-27 LAB — CREATININE, SERUM
Creatinine, Ser: 0.97 mg/dL (ref 0.44–1.00)
GFR calc Af Amer: >60 mL/min (ref >60)
GFR calc non Af Amer: 55 mL/min — ABNORMAL LOW (ref >60)

## 2018-09-27 LAB — SARS CORONAVIRUS 2 BY RT PCR (HOSPITAL ORDER, PERFORMED IN ~~LOC~~ HOSPITAL LAB): SARS Coronavirus 2: NEGATIVE

## 2018-09-27 LAB — LIPASE, BLOOD: Lipase: 35 U/L (ref 11–51)

## 2018-09-27 MED ORDER — PANTOPRAZOLE SODIUM 40 MG IV SOLR
40.0000 mg | Freq: Every day | INTRAVENOUS | Status: DC
  Administered 2018-09-27: 40 mg via INTRAVENOUS
  Filled 2018-09-27: qty 40

## 2018-09-27 MED ORDER — ENOXAPARIN SODIUM 40 MG/0.4ML ~~LOC~~ SOLN
40.0000 mg | SUBCUTANEOUS | Status: DC
  Administered 2018-09-27 – 2018-09-29 (×3): 40 mg via SUBCUTANEOUS
  Filled 2018-09-27 (×3): qty 0.4

## 2018-09-27 MED ORDER — SODIUM CHLORIDE 0.9% FLUSH
3.0000 mL | Freq: Two times a day (BID) | INTRAVENOUS | Status: DC
  Administered 2018-09-27 – 2018-09-30 (×6): 3 mL via INTRAVENOUS

## 2018-09-27 MED ORDER — ONDANSETRON 4 MG PO TBDP
4.0000 mg | ORAL_TABLET | Freq: Four times a day (QID) | ORAL | Status: DC | PRN
  Administered 2018-09-29: 4 mg via ORAL
  Filled 2018-09-27: qty 1

## 2018-09-27 MED ORDER — KETOROLAC TROMETHAMINE 15 MG/ML IJ SOLN
15.0000 mg | Freq: Three times a day (TID) | INTRAMUSCULAR | Status: DC
  Administered 2018-09-27 – 2018-09-30 (×8): 15 mg via INTRAVENOUS
  Filled 2018-09-27 (×8): qty 1

## 2018-09-27 MED ORDER — HYDRALAZINE HCL 20 MG/ML IJ SOLN: 10.0000 mg | INTRAMUSCULAR | Status: DC | PRN

## 2018-09-27 MED ORDER — SODIUM CHLORIDE (PF) 0.9 % IJ SOLN
INTRAMUSCULAR | Status: AC
  Administered 2018-09-27: 13:00:00
  Filled 2018-09-27: qty 50

## 2018-09-27 MED ORDER — IOHEXOL 300 MG/ML  SOLN
100.0000 mL | Freq: Once | INTRAMUSCULAR | Status: AC | PRN
  Administered 2018-09-27: 100 mL via INTRAVENOUS

## 2018-09-27 MED ORDER — HYDROCODONE-ACETAMINOPHEN 5-325 MG PO TABS
1.0000 | ORAL_TABLET | ORAL | Status: DC | PRN
  Administered 2018-09-28 – 2018-09-29 (×3): 1 via ORAL
  Filled 2018-09-27 (×3): qty 1

## 2018-09-27 MED ORDER — FENTANYL CITRATE (PF) 100 MCG/2ML IJ SOLN
50.0000 ug | Freq: Once | INTRAMUSCULAR | Status: AC
  Administered 2018-09-27: 50 ug via INTRAVENOUS
  Filled 2018-09-27: qty 2

## 2018-09-27 MED ORDER — SODIUM CHLORIDE 0.9% FLUSH: 3.0000 mL | INTRAVENOUS | Status: DC | PRN

## 2018-09-27 MED ORDER — MORPHINE SULFATE (PF) 2 MG/ML IV SOLN
2.0000 mg | INTRAVENOUS | Status: DC | PRN
  Administered 2018-09-27: 4 mg via INTRAVENOUS
  Administered 2018-09-27 – 2018-09-28 (×2): 2 mg via INTRAVENOUS
  Administered 2018-09-28 – 2018-09-29 (×2): 4 mg via INTRAVENOUS
  Administered 2018-09-29: 2 mg via INTRAVENOUS
  Filled 2018-09-27 (×2): qty 2
  Filled 2018-09-27 (×2): qty 1
  Filled 2018-09-27: qty 2
  Filled 2018-09-27: qty 1

## 2018-09-27 MED ORDER — SODIUM CHLORIDE 0.9 % IV SOLN: 250.0000 mL | INTRAVENOUS | Status: DC | PRN

## 2018-09-27 MED ORDER — DOCUSATE SODIUM 100 MG PO CAPS
100.0000 mg | ORAL_CAPSULE | Freq: Two times a day (BID) | ORAL | Status: DC
  Administered 2018-09-28 – 2018-09-30 (×5): 100 mg via ORAL
  Filled 2018-09-27 (×5): qty 1

## 2018-09-27 MED ORDER — PANTOPRAZOLE SODIUM 40 MG PO TBEC
40.0000 mg | DELAYED_RELEASE_TABLET | Freq: Every day | ORAL | Status: DC
  Administered 2018-09-28 – 2018-09-30 (×3): 40 mg via ORAL
  Filled 2018-09-27 (×3): qty 1

## 2018-09-27 MED ORDER — FENTANYL CITRATE (PF) 100 MCG/2ML IJ SOLN
50.0000 ug | Freq: Once | INTRAMUSCULAR | Status: AC
  Administered 2018-09-27: 11:00:00 50 ug via INTRAVENOUS
  Filled 2018-09-27: qty 2

## 2018-09-27 MED ORDER — ONDANSETRON HCL 4 MG/2ML IJ SOLN: 4.0000 mg | Freq: Four times a day (QID) | INTRAMUSCULAR | Status: DC | PRN

## 2018-09-27 MED ORDER — MORPHINE SULFATE (PF) 4 MG/ML IV SOLN
4.0000 mg | Freq: Once | INTRAVENOUS | Status: AC
  Administered 2018-09-27: 14:00:00 4 mg via INTRAVENOUS
  Filled 2018-09-27: qty 1

## 2018-09-27 MED ORDER — METHOCARBAMOL 500 MG PO TABS
500.0000 mg | ORAL_TABLET | Freq: Three times a day (TID) | ORAL | Status: DC
  Administered 2018-09-28 – 2018-09-30 (×6): 500 mg via ORAL
  Filled 2018-09-27 (×7): qty 1

## 2018-09-27 NOTE — H&P (Addendum)
[facility] Surgery Consult/Admission Note  [place] 1935/06/04  [id number].    Requesting Provider: Dr. Rex Kras Chief Complaint/Reason for Consult: Fall, rib fractures  HPI:   Patient is a 83 year old [place] speaking female with a history of OSA on CPAP, CKD, DDD, HTN, osteoporosis who presented emergency department today after a fall.  Husband at bedside.  [employer] interpreter, [name], used for interpretation.  Patient tripped on a carpet in the bathroom and fell onto her right side onto the commode.  She denies hitting her head or LOC.  She is complaining of severe, constant, right-sided rib pain radiating into her back, worse with deep breaths and movement.  After she fell she crawled out of the bathroom where her husband found her.  Patient denies any abdominal pain prior to her fall.  She states her bowel movements have been normal.  She has no other complaints or associated symptoms at this time.  I discussed CT findings of gallbladder mass and transfer to [facility] with her and her husband via the interpreter.   ROS:  Review of Systems  Constitutional: Negative for chills, diaphoresis and fever.  HENT: Negative for sore throat.   Respiratory: Negative for cough and shortness of breath.   Cardiovascular: Negative for chest pain.  Gastrointestinal: Negative for abdominal pain, blood in stool, constipation, diarrhea, nausea and vomiting.  Genitourinary: Negative for dysuria.  Musculoskeletal: Positive for falls.       + Right-sided rib pain, right shoulder pain  Skin: Negative for rash.  Neurological: Negative for dizziness, sensory change, focal weakness and loss of consciousness.  All other systems reviewed and are negative.    History reviewed. No pertinent family history.  Past Medical History:  Diagnosis Date  . Allergic rhinitis   . Asthma   . Hyperlipidemia   . Hypertension   . Osteoporosis   . Pericardial effusion 05/20/2018  . Sleep apnea     Past  Surgical History:  Procedure Laterality Date  . CARDIAC SURGERY  2014   Unknown type    Social History:  reports that she has never smoked. She has never used smokeless tobacco. She reports previous alcohol use. She reports previous drug use.  Allergies: No Known Allergies  (Not in a hospital admission)   Blood pressure (!) 167/93, pulse 86, temperature 98.4 F (36.9 C), temperature source Oral, resp. rate 17, height 5\' 7"  (1.702 m), weight 61.2 kg, SpO2 95 %.  Physical Exam Vitals signs and nursing note reviewed.  Constitutional:      General: She is not in acute distress.    Appearance: Normal appearance. She is not toxic-appearing or diaphoretic.  HENT:     Head: Normocephalic and atraumatic.     Nose: Nose normal.     Mouth/Throat:     Comments: Patient wearing mask Eyes:     General: No scleral icterus.       Right eye: No discharge.        Left eye: No discharge.     Conjunctiva/sclera: Conjunctivae normal.     Pupils: Pupils are equal, round, and reactive to light.  Neck:     Musculoskeletal: Normal range of motion and neck supple.  Cardiovascular:     Rate and Rhythm: Normal rate and regular rhythm.     Pulses:          Radial pulses are 2+ on the right side and 2+ on the left side.       Dorsalis pedis pulses are 2+ on  the right side and 2+ on the left side.     Heart sounds: Normal heart sounds. No murmur.  Pulmonary:     Effort: Pulmonary effort is normal. No respiratory distress.     Breath sounds: Normal breath sounds. No wheezing, rhonchi or rales.  Chest:     Chest wall: Tenderness (Right-sided ribs) present. No lacerations, deformity, swelling or edema.  Abdominal:     General: Bowel sounds are normal. There is no distension.     Palpations: Abdomen is soft. Abdomen is not rigid.     Tenderness: There is no abdominal tenderness. There is no guarding.     Hernia: A hernia is present. Hernia is present in the umbilical area.     Comments: Patient  appears slightly distended but husband states this is her baseline, small reducible umbilical hernia  Musculoskeletal: Normal range of motion.        General: No tenderness, deformity or signs of injury.     Right lower leg: No edema.     Left lower leg: No edema.  Skin:    General: Skin is warm and dry.     Findings: No rash.  Neurological:     Mental Status: She is alert and oriented to person, place, and time.     Results for orders placed or performed during the hospital encounter of 09/27/18 (from the past 48 hour(s))  CBC with Differential     Status: Abnormal   Collection Time: 09/27/18 10:55 AM  Result Value Ref Range   WBC 14.0 (H) 4.0 - 10.5 K/uL   RBC 4.23 3.87 - 5.11 MIL/uL   Hemoglobin 12.9 12.0 - 15.0 g/dL   HCT 42.1 36.0 - 46.0 %   MCV 99.5 80.0 - 100.0 fL   MCH 30.5 26.0 - 34.0 pg   MCHC 30.6 30.0 - 36.0 g/dL   RDW 14.2 11.5 - 15.5 %   Platelets 281 150 - 400 K/uL   nRBC 0.0 0.0 - 0.2 %   Neutrophils Relative % 70 %   Neutro Abs 9.8 (H) 1.7 - 7.7 K/uL   Lymphocytes Relative 20 %   Lymphs Abs 2.8 0.7 - 4.0 K/uL   Monocytes Relative 8 %   Monocytes Absolute 1.1 (H) 0.1 - 1.0 K/uL   Eosinophils Relative 1 %   Eosinophils Absolute 0.1 0.0 - 0.5 K/uL   Basophils Relative 0 %   Basophils Absolute 0.0 0.0 - 0.1 K/uL   Immature Granulocytes 1 %   Abs Immature Granulocytes 0.13 (H) 0.00 - 0.07 K/uL    Comment: Performed at [facility], [place] [address] [place], [place] [address]  Comprehensive metabolic panel     Status: Abnormal   Collection Time: 09/27/18 10:55 AM  Result Value Ref Range   Sodium 143 135 - 145 mmol/L   Potassium 4.1 3.5 - 5.1 mmol/L   Chloride 104 98 - 111 mmol/L   CO2 27 22 - 32 mmol/L   Glucose, Bld 111 (H) 70 - 99 mg/dL   BUN 17 8 - 23 mg/dL   Creatinine, Ser 0.82 0.44 - 1.00 mg/dL   Calcium 9.1 8.9 - 10.3 mg/dL   Total Protein 7.5 6.5 - 8.1 g/dL   Albumin 4.0 3.5 - 5.0 g/dL   AST 30 15 - 41 U/L   ALT 34 0 - 44 U/L    Alkaline Phosphatase 86 38 - 126 U/L   Total Bilirubin 0.6 0.3 - 1.2 mg/dL   GFR calc  non Af Amer >60 >60 mL/min   GFR calc Af Amer >60 >60 mL/min   Anion gap 12 5 - 15    Comment: Performed at [facility], [place] [address] [place], [place] [address]  Lipase, blood     Status: None   Collection Time: 09/27/18 10:55 AM  Result Value Ref Range   Lipase 35 11 - 51 U/L    Comment: Performed at [facility], [place] [address] [place], [place] [address]   Ct Chest W Contrast  Result Date: 09/27/2018 CLINICAL DATA:  Right-sided pain after fall. EXAM: CT CHEST, ABDOMEN, AND PELVIS WITH CONTRAST TECHNIQUE: Multidetector CT imaging of the chest, abdomen and pelvis was performed following the standard protocol during bolus administration of intravenous contrast. CONTRAST:  16mL OMNIPAQUE IOHEXOL 300 MG/ML  SOLN COMPARISON:  Chest x-ray dated January 14, 2018. FINDINGS: CT CHEST FINDINGS Cardiovascular: Mild right atrial enlargement. Prominent pericardial fat. No pericardial effusion. No thoracic aortic aneurysm or dissection. Coronary, aortic arch, and branch vessel atherosclerotic vascular disease. No central pulmonary embolism. Mediastinum/Nodes: No enlarged mediastinal, hilar, or axillary lymph nodes. 8 mm hypodense nodule in the left thyroid lobe. The trachea and esophagus demonstrate no significant findings. Lungs/Pleura: Bilateral lower lobe subsegmental atelectasis. Trace right pleural effusion. No pneumothorax. No consolidation. 4 mm subpleural nodule in the anterior right upper lobe (series 6, image 71). Bronchiectasis and collapse of the right middle lobe, likely sequelae of chronic atypical infection. Musculoskeletal: Acute fractures of the right posterior and lateral seventh rib, posterior eighth rib, and posterior and lateral ninth rib. The posterior ninth rib fracture is moderately displaced. CT ABDOMEN PELVIS FINDINGS Hepatobiliary: Hepatic steatosis.  Annular lesion in the gallbladder body with narrowing, wall thickening, and wall hyperenhancement. Similar appearing infiltrative mass-like foci in the adjacent liver. No biliary dilatation. Pancreas: Mild atrophy. No ductal dilatation or surrounding inflammatory changes. Spleen: No splenic injury or perisplenic hematoma. Adrenals/Urinary Tract: No adrenal hemorrhage or renal injury identified. Bladder is unremarkable. Stomach/Bowel: Stomach is within normal limits. Appendix appears normal. No evidence of bowel wall thickening, distention, or inflammatory changes. Vascular/Lymphatic: Enlarged heterogeneously enhancing portacaval lymph node measuring 1.5 cm in short axis. Aortic atherosclerosis. Reproductive: Uterus and bilateral adnexa are unremarkable. Other: None. Musculoskeletal: No acute or significant osseous findings. Severe right hip osteoarthritis. 8.2 cm lipoma in the right tensor fascia lata. IMPRESSION: Chest: 1. Acute fractures of the right seventh through ninth ribs. The seventh and ninth ribs are fractured in two places. 2. Trace right pleural effusion.  No pneumothorax. 3. Bronchiectasis and collapse of the right middle lobe, likely sequelae of chronic atypical infection. 4.  Aortic atherosclerosis (ICD10-I70.0). Abdomen and pelvis: 1. Annular lesion in the gallbladder body with narrowing, wall thickening, and wall hyperenhancement. Similar appearing infiltrative mass-like foci in the adjacent liver. 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 2. Enlarged heterogeneously enhancing portacaval lymph node, concerning for nodal metastasis. Electronically Signed   By: Titus Dubin M.D.   On: 09/27/2018 13:33   Ct Abdomen Pelvis W Contrast  Result Date: 09/27/2018 CLINICAL DATA:  Right-sided pain after fall. EXAM: CT CHEST, ABDOMEN, AND PELVIS WITH CONTRAST TECHNIQUE: Multidetector CT imaging of the chest, abdomen and pelvis was performed following the standard  protocol during bolus administration of intravenous contrast. CONTRAST:  126mL OMNIPAQUE IOHEXOL 300 MG/ML  SOLN COMPARISON:  Chest x-ray dated January 14, 2018. FINDINGS: CT CHEST FINDINGS Cardiovascular: Mild right atrial enlargement. Prominent pericardial fat. No pericardial effusion. No thoracic  aortic aneurysm or dissection. Coronary, aortic arch, and branch vessel atherosclerotic vascular disease. No central pulmonary embolism. Mediastinum/Nodes: No enlarged mediastinal, hilar, or axillary lymph nodes. 8 mm hypodense nodule in the left thyroid lobe. The trachea and esophagus demonstrate no significant findings. Lungs/Pleura: Bilateral lower lobe subsegmental atelectasis. Trace right pleural effusion. No pneumothorax. No consolidation. 4 mm subpleural nodule in the anterior right upper lobe (series 6, image 71). Bronchiectasis and collapse of the right middle lobe, likely sequelae of chronic atypical infection. Musculoskeletal: Acute fractures of the right posterior and lateral seventh rib, posterior eighth rib, and posterior and lateral ninth rib. The posterior ninth rib fracture is moderately displaced. CT ABDOMEN PELVIS FINDINGS Hepatobiliary: Hepatic steatosis. Annular lesion in the gallbladder body with narrowing, wall thickening, and wall hyperenhancement. Similar appearing infiltrative mass-like foci in the adjacent liver. No biliary dilatation. Pancreas: Mild atrophy. No ductal dilatation or surrounding inflammatory changes. Spleen: No splenic injury or perisplenic hematoma. Adrenals/Urinary Tract: No adrenal hemorrhage or renal injury identified. Bladder is unremarkable. Stomach/Bowel: Stomach is within normal limits. Appendix appears normal. No evidence of bowel wall thickening, distention, or inflammatory changes. Vascular/Lymphatic: Enlarged heterogeneously enhancing portacaval lymph node measuring 1.5 cm in short axis. Aortic atherosclerosis. Reproductive: Uterus and bilateral adnexa are  unremarkable. Other: None. Musculoskeletal: No acute or significant osseous findings. Severe right hip osteoarthritis. 8.2 cm lipoma in the right tensor fascia lata. IMPRESSION: Chest: 1. Acute fractures of the right seventh through ninth ribs. The seventh and ninth ribs are fractured in two places. 2. Trace right pleural effusion.  No pneumothorax. 3. Bronchiectasis and collapse of the right middle lobe, likely sequelae of chronic atypical infection. 4.  Aortic atherosclerosis (ICD10-I70.0). Abdomen and pelvis: 1. Annular lesion in the gallbladder body with narrowing, wall thickening, and wall hyperenhancement. Similar appearing infiltrative mass-like foci in the adjacent liver. 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 2. Enlarged heterogeneously enhancing portacaval lymph node, concerning for nodal metastasis. Electronically Signed   By: Titus Dubin M.D.   On: 09/27/2018 13:33      Assessment/Plan Active Problems:   * No active hospital problems. 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   Kalman Drape, [facility] Surgery 09/27/2018, 3:17 PM Pager: [phone] Consults: [phone] Mon-Fri 7:00 am-4:30 pm Sat-Sun 7:00 am-11:30 am

## 2018-09-27 NOTE — ED Notes (Signed)
Dossie Der was used to communicate with patient for triage.

## 2018-09-27 NOTE — ED Provider Notes (Signed)
[facility] DEPT Provider Note   CSN: [id number] Arrival date & time: 09/27/18  0858     History   Chief Complaint Chief Complaint  Patient presents with   Fall    HPI Natalie Walls is a 83 y.o. female.     83yo F w/ PMH below who p/w fall. Today just PTA, she was in the bathroom and stood up, tripped on carpet, and fell onto right side. She is having severe, constant right sided pain including her shoulder, ribs, and back. She denies head injury or LOC. She laid on the ground for about 15 min before she was able to crawl out of bathroom. Pain is worse with movement and breathing. No numbness or weakness of extremities.  The history is provided by the patient. The history is limited by a language barrier. A language interpreter was used.  Fall    Past Medical History:  Diagnosis Date   Allergic rhinitis    Asthma    Hyperlipidemia    Hypertension    Osteoporosis    Pericardial effusion 05/20/2018   Sleep apnea     Patient Active Problem List   Diagnosis Date Noted   Essential hypertension 06/24/2018   Pericardial effusion 05/20/2018   Chronic kidney disease, stage 3 ([place]) 10/15/2017   Allergic rhinitis 09/12/2017   Asthma 09/12/2017   DDD (degenerative disc disease), cervical 09/12/2017   Generalized pruritus 09/12/2017   Hyperlipidemia 09/12/2017   Nocturia 09/12/2017   Obstructive sleep apnea syndrome 09/12/2017   Osteoporosis 09/12/2017   Primary insomnia 09/12/2017   Vitamin D deficiency 09/12/2017   Restless legs 05/30/2017   Carotid artery bruit 09/23/2015   Back pain 11/28/2010   Fatigue 10/20/2010    Past Surgical History:  Procedure Laterality Date   CARDIAC SURGERY  2014   Unknown type     OB History   No obstetric history on file.      Home Medications    Prior to Admission medications   Medication Sig Start Date End Date Taking? Authorizing Provider  albuterol (PROAIR HFA)  108 (90 Base) MCG/ACT inhaler ProAir HFA 90 mcg/actuation aerosol inhaler  Inhale 2 puffs every 4 hours by inhalation route as needed. 05/14/14   [provider]  atorvastatin (LIPITOR) 80 MG tablet atorvastatin 80 mg tablet  use one-half daily in the evenings. 06/19/17   [provider]  cholecalciferol (VITAMIN D3) 25 MCG (1000 UT) tablet Take 1,000 Units by mouth daily.    [provider]  cyclobenzaprine (FLEXERIL) 5 MG tablet cyclobenzaprine 5 mg tablet  Take 1.5 tablets as needed by oral route at bedtime for 30 days.  for muscle cramps ; may use up to 2 x a day as needed    [provider]  doxepin (SINEQUAN) 10 MG capsule 1 at bedtime for leg cramps and sleep 06/24/18   Deneise Lever, MD  fluticasone [facility]) 50 MCG/ACT nasal spray fluticasone propionate 50 mcg/actuation nasal spray,suspension  as needed for seasonal allergies 05/14/14   [provider]  losartan (COZAAR) 25 MG tablet losartan 25 mg tablet  Take 1 tablet every day by oral route for 30 days.    [provider]    Family History History reviewed. No pertinent family history.  Social History Social History   Tobacco Use   Smoking status: Never Smoker   Smokeless tobacco: Never Used  Substance Use Topics   Alcohol use: Not Currently   Drug use: Not Currently  Allergies   Patient has no known allergies.   Review of Systems Review of Systems All other systems reviewed and are negative except that which was mentioned in HPI   Physical Exam Updated Vital Signs BP (!) 167/93    Pulse 86    Temp 98.4 F (36.9 C) (Oral)    Resp 17    Ht 5\' 7"  (1.702 m)    Wt 61.2 kg    SpO2 95%    BMI 21.14 kg/m   Physical Exam Vitals signs and nursing note reviewed.  Constitutional:      General: She is not in acute distress.    Appearance: She is well-developed.     Comments: Uncomfortable, in pain  HENT:     Head: Normocephalic and atraumatic.  Eyes:      Conjunctiva/sclera: Conjunctivae normal.     Pupils: Pupils are equal, round, and reactive to light.  Neck:     Musculoskeletal: Neck supple.  Cardiovascular:     Rate and Rhythm: Normal rate and regular rhythm.     Heart sounds: Normal heart sounds. No murmur.  Pulmonary:     Comments: Shallow breathing and mild tachypnea without respiratory distress Chest:     Chest wall: Tenderness (R lateral chest wall, no crepitus) present.  Abdominal:     General: Bowel sounds are normal. There is no distension.     Palpations: Abdomen is soft.     Tenderness: There is abdominal tenderness (RUQ near costal margin).  Musculoskeletal:        General: Tenderness (R shoulder, no obvious injury) present. No swelling.  Skin:    General: Skin is warm and dry.  Neurological:     Mental Status: She is alert and oriented to person, place, and time.     Comments: Fluent speech  Psychiatric:        Judgment: Judgment normal.      ED Treatments / Results  Labs (all labs ordered are listed, but only abnormal results are displayed) Labs Reviewed  CBC WITH DIFFERENTIAL/PLATELET - Abnormal; Notable for the following components:      Result Value   WBC 14.0 (*)    Neutro Abs 9.8 (*)    Monocytes Absolute 1.1 (*)    Abs Immature Granulocytes 0.13 (*)    All other components within normal limits  COMPREHENSIVE METABOLIC PANEL - Abnormal; Notable for the following components:   Glucose, Bld 111 (*)    All other components within normal limits  SARS CORONAVIRUS 2 (HOSPITAL ORDER, [place])  LIPASE, BLOOD    EKG EKG Interpretation  Date/Time:  Friday September 27 2018 11:09:07 EDT Ventricular Rate:  84 PR Interval:    QRS Duration: 71 QT Interval:  462 QTC Calculation: 547 R Axis:   10 Text Interpretation:  Sinus rhythm Probable left atrial enlargement Borderline abnrm T, anterolateral leads Prolonged QT interval No previous ECGs available Confirmed by Theotis Burrow [phone]) on 09/27/2018 11:57:10 AM   Radiology Ct Chest W Contrast  Result Date: 09/27/2018 CLINICAL DATA:  Right-sided pain after fall. EXAM: CT CHEST, ABDOMEN, AND PELVIS WITH CONTRAST TECHNIQUE: Multidetector CT imaging of the chest, abdomen and pelvis was performed following the standard protocol during bolus administration of intravenous contrast. CONTRAST:  151mL OMNIPAQUE IOHEXOL 300 MG/ML  SOLN COMPARISON:  Chest x-ray dated January 14, 2018. FINDINGS: CT CHEST FINDINGS Cardiovascular: Mild right atrial enlargement. Prominent pericardial fat. No pericardial effusion. No thoracic aortic aneurysm or dissection. Coronary, aortic  arch, and branch vessel atherosclerotic vascular disease. No central pulmonary embolism. Mediastinum/Nodes: No enlarged mediastinal, hilar, or axillary lymph nodes. 8 mm hypodense nodule in the left thyroid lobe. The trachea and esophagus demonstrate no significant findings. Lungs/Pleura: Bilateral lower lobe subsegmental atelectasis. Trace right pleural effusion. No pneumothorax. No consolidation. 4 mm subpleural nodule in the anterior right upper lobe (series 6, image 71). Bronchiectasis and collapse of the right middle lobe, likely sequelae of chronic atypical infection. Musculoskeletal: Acute fractures of the right posterior and lateral seventh rib, posterior eighth rib, and posterior and lateral ninth rib. The posterior ninth rib fracture is moderately displaced. CT ABDOMEN PELVIS FINDINGS Hepatobiliary: Hepatic steatosis. Annular lesion in the gallbladder body with narrowing, wall thickening, and wall hyperenhancement. Similar appearing infiltrative mass-like foci in the adjacent liver. No biliary dilatation. Pancreas: Mild atrophy. No ductal dilatation or surrounding inflammatory changes. Spleen: No splenic injury or perisplenic hematoma. Adrenals/Urinary Tract: No adrenal hemorrhage or renal injury identified. Bladder is unremarkable. Stomach/Bowel: Stomach is within  normal limits. Appendix appears normal. No evidence of bowel wall thickening, distention, or inflammatory changes. Vascular/Lymphatic: Enlarged heterogeneously enhancing portacaval lymph node measuring 1.5 cm in short axis. Aortic atherosclerosis. Reproductive: Uterus and bilateral adnexa are unremarkable. Other: None. Musculoskeletal: No acute or significant osseous findings. Severe right hip osteoarthritis. 8.2 cm lipoma in the right tensor fascia lata. IMPRESSION: Chest: 1. Acute fractures of the right seventh through ninth ribs. The seventh and ninth ribs are fractured in two places. 2. Trace right pleural effusion.  No pneumothorax. 3. Bronchiectasis and collapse of the right middle lobe, likely sequelae of chronic atypical infection. 4.  Aortic atherosclerosis (ICD10-I70.0). Abdomen and pelvis: 1. Annular lesion in the gallbladder body with narrowing, wall thickening, and wall hyperenhancement. Similar appearing infiltrative mass-like foci in the adjacent liver. 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 2. Enlarged heterogeneously enhancing portacaval lymph node, concerning for nodal metastasis. Electronically Signed   By: Titus Dubin M.D.   On: 09/27/2018 13:33   Ct Abdomen Pelvis W Contrast  Result Date: 09/27/2018 CLINICAL DATA:  Right-sided pain after fall. EXAM: CT CHEST, ABDOMEN, AND PELVIS WITH CONTRAST TECHNIQUE: Multidetector CT imaging of the chest, abdomen and pelvis was performed following the standard protocol during bolus administration of intravenous contrast. CONTRAST:  178mL OMNIPAQUE IOHEXOL 300 MG/ML  SOLN COMPARISON:  Chest x-ray dated January 14, 2018. FINDINGS: CT CHEST FINDINGS Cardiovascular: Mild right atrial enlargement. Prominent pericardial fat. No pericardial effusion. No thoracic aortic aneurysm or dissection. Coronary, aortic arch, and branch vessel atherosclerotic vascular disease. No central pulmonary embolism.  Mediastinum/Nodes: No enlarged mediastinal, hilar, or axillary lymph nodes. 8 mm hypodense nodule in the left thyroid lobe. The trachea and esophagus demonstrate no significant findings. Lungs/Pleura: Bilateral lower lobe subsegmental atelectasis. Trace right pleural effusion. No pneumothorax. No consolidation. 4 mm subpleural nodule in the anterior right upper lobe (series 6, image 71). Bronchiectasis and collapse of the right middle lobe, likely sequelae of chronic atypical infection. Musculoskeletal: Acute fractures of the right posterior and lateral seventh rib, posterior eighth rib, and posterior and lateral ninth rib. The posterior ninth rib fracture is moderately displaced. CT ABDOMEN PELVIS FINDINGS Hepatobiliary: Hepatic steatosis. Annular lesion in the gallbladder body with narrowing, wall thickening, and wall hyperenhancement. Similar appearing infiltrative mass-like foci in the adjacent liver. No biliary dilatation. Pancreas: Mild atrophy. No ductal dilatation or surrounding inflammatory changes. Spleen: No splenic injury or perisplenic hematoma. Adrenals/Urinary Tract: No adrenal hemorrhage or renal injury identified. Bladder is  unremarkable. Stomach/Bowel: Stomach is within normal limits. Appendix appears normal. No evidence of bowel wall thickening, distention, or inflammatory changes. Vascular/Lymphatic: Enlarged heterogeneously enhancing portacaval lymph node measuring 1.5 cm in short axis. Aortic atherosclerosis. Reproductive: Uterus and bilateral adnexa are unremarkable. Other: None. Musculoskeletal: No acute or significant osseous findings. Severe right hip osteoarthritis. 8.2 cm lipoma in the right tensor fascia lata. IMPRESSION: Chest: 1. Acute fractures of the right seventh through ninth ribs. The seventh and ninth ribs are fractured in two places. 2. Trace right pleural effusion.  No pneumothorax. 3. Bronchiectasis and collapse of the right middle lobe, likely sequelae of chronic atypical  infection. 4.  Aortic atherosclerosis (ICD10-I70.0). Abdomen and pelvis: 1. Annular lesion in the gallbladder body with narrowing, wall thickening, and wall hyperenhancement. Similar appearing infiltrative mass-like foci in the adjacent liver. 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 2. Enlarged heterogeneously enhancing portacaval lymph node, concerning for nodal metastasis. Electronically Signed   By: Titus Dubin M.D.   On: 09/27/2018 13:33    Procedures Procedures (including critical care time)  Medications Ordered in ED Medications  fentaNYL (SUBLIMAZE) injection 50 mcg (50 mcg Intravenous Given 09/27/18 1049)  fentaNYL (SUBLIMAZE) injection 50 mcg (50 mcg Intravenous Given 09/27/18 1210)  iohexol (OMNIPAQUE) 300 MG/ML solution 100 mL (100 mLs Intravenous Contrast Given 09/27/18 1235)  sodium chloride (PF) 0.9 % injection (  Given by Other 09/27/18 1241)  morphine 4 MG/ML injection 4 mg (4 mg Intravenous Given 09/27/18 1422)     Initial Impression / Assessment and Plan / ED Course  I have reviewed the triage vital signs and the nursing notes.  Pertinent labs & imaging results that were available during my care of the patient were reviewed by me and considered in my medical decision making (see chart for details).       PT uncomfortable on exam, hypertensive, O2 sat normal. LAbs show WBC 14. CT chest through pelvis shows rib fx R 7th-9th w/ trace pleural effusion.  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  LFTs today are reassuring.  She has denied any preceding symptoms of postprandial pain or vomiting.  She continues to have significant pain requiring IV narcotics therefore contacted trauma surgery.  Discussed with PA Janett Billow.  We reviewed CT imaging findings and she feels this can be worked up inpatient while pt is admitted for pain  control. She will admit to trauma service at [facility].   Final Clinical Impressions(s) / ED Diagnoses   Final diagnoses:  Closed fracture of multiple ribs of right side, initial encounter  Gallbladder mass    ED Discharge Orders    None       Minyon Billiter, Wenda Overland, MD 09/27/18 [phone]

## 2018-09-27 NOTE — ED Triage Notes (Signed)
Patient arrived by EMS from home. Pt had fall in bathroom. Pt lost balance and hit counter on the mid lower back.   Pt c/o mid back pain.   Pt only speaks vietnamese.

## 2018-09-28 ENCOUNTER — Inpatient Hospital Stay (HOSPITAL_COMMUNITY): Payer: Medicare Other

## 2018-09-28 LAB — CBC
HCT: 38.1 % (ref 36.0–46.0)
Hemoglobin: 11.9 g/dL — ABNORMAL LOW (ref 12.0–15.0)
MCH: 30.6 pg (ref 26.0–34.0)
MCHC: 31.2 g/dL (ref 30.0–36.0)
MCV: 97.9 fL (ref 80.0–100.0)
Platelets: 253 10*3/uL (ref 150–400)
RBC: 3.89 MIL/uL (ref 3.87–5.11)
RDW: 14.2 % (ref 11.5–15.5)
WBC: 7.9 10*3/uL (ref 4.0–10.5)
nRBC: 0 % (ref 0.0–0.2)

## 2018-09-28 MED ORDER — OXYCODONE HCL 5 MG PO TABS
5.0000 mg | ORAL_TABLET | Freq: Four times a day (QID) | ORAL | Status: DC | PRN
  Administered 2018-09-28: 5 mg via ORAL
  Filled 2018-09-28: qty 1

## 2018-09-28 MED ORDER — GADOBUTROL 1 MMOL/ML IV SOLN
6.0000 mL | Freq: Once | INTRAVENOUS | Status: AC | PRN
  Administered 2018-09-28: 6 mL via INTRAVENOUS

## 2018-09-28 NOTE — Evaluation (Addendum)
Occupational Therapy Evaluation Patient Details Name: Natalie Walls MRN: [id number] DOB: 01-01-1936 Today's Date: 09/28/2018    History of Present Illness 61 female presenting with right 7-9 rib fxs post fall. Incidental finding of gallbladder mass with MRI showing mutiple concerning masses of liver. PMH including pericardial effusion, HTN, and asthma.   Clinical Impression   PTA, pt was living with her son and was independent with ADLs. Pt currently requiring Mod A for UB ADLs, Max A for LB ADLs, and Max A for functional transfers. Pt limited by significant pain and requiring increased encouragement for OOB activity. Pt family very supportive and available to assist at home. Pt would benefit from further acute OT to facilitate safe dc. Recommend dc to home with HHOT for further OT to optimize safety, independence with ADLs, and return to PLOF.      Follow Up Recommendations  Home health OT;Supervision/Assistance - 24 hour    Equipment Recommendations  3 in 1 bedside commode    Recommendations for Other Services PT consult     Precautions / Restrictions Precautions Precautions: Other (comment)(Right rib fxs)      Mobility Bed Mobility Overal bed mobility: Needs Assistance Bed Mobility: Rolling;Sidelying to Sit Rolling: Max assist;+2 for physical assistance Sidelying to sit: Max assist;+2 for physical assistance       General bed mobility comments: Max A +2 for log roll to left side. Pt with decreased participation due to significant pain  Transfers Overall transfer level: Needs assistance Equipment used: None Transfers: Squat Pivot Transfers     Squat pivot transfers: Max assist;+2 safety/equipment     General transfer comment: Max A to squat pivot to recliner with +2 for safety    Balance Overall balance assessment: Needs assistance Sitting-balance support: No upper extremity supported;Feet supported Sitting balance-Leahy Scale: Poor     Standing balance  support: No upper extremity supported;During functional activity Standing balance-Leahy Scale: Poor                             ADL either performed or assessed with clinical judgement   ADL Overall ADL's : Needs assistance/impaired Eating/Feeding: Set up;Sitting   Grooming: Set up;Sitting   Upper Body Bathing: Moderate assistance;Sitting;Cueing for safety   Lower Body Bathing: Maximal assistance;Sit to/from stand   Upper Body Dressing : Moderate assistance;Sitting   Lower Body Dressing: Maximal assistance;Sit to/from stand   Toilet Transfer: Maximal assistance;+2 for safety/equipment;Squat-pivot(simulated to recliner)           Functional mobility during ADLs: Maximal assistance(squat pivot only)       Vision         Perception     Praxis      Pertinent Vitals/Pain Pain Assessment: Faces Faces Pain Scale: Hurts whole lot Pain Location: Ribs Pain Descriptors / Indicators: Constant;Discomfort;Grimacing;Moaning Pain Intervention(s): Monitored during session;Limited activity within patient's tolerance;Repositioned     Hand Dominance     Extremity/Trunk Assessment Upper Extremity Assessment Upper Extremity Assessment: Generalized weakness   Lower Extremity Assessment Lower Extremity Assessment: Defer to PT evaluation   Cervical / Trunk Assessment Cervical / Trunk Assessment: Other exceptions Cervical / Trunk Exceptions: R ribs   Communication Communication Communication: Prefers language other than English(Vietnamese; stratus interpreter Khat [id number])   Cognition Arousal/Alertness: Lethargic;Suspect due to medications(difficult to assess with language barrier) Behavior During Therapy: [facility] for tasks assessed/performed Overall Cognitive Status: Difficult to assess  General Comments  Son present throughout session. VSS    Exercises     Shoulder Instructions      Home Living Family/patient  expects to be discharged to:: Private residence Living Arrangements: Children(Son) Available Help at Discharge: Family;Available 24 hours/day Type of Home: House Home Access: Stairs to enter     Home Layout: Two level;Able to live on main level with bedroom/bathroom;Bed/bath upstairs Alternate Level Stairs-Number of Steps: Flight   Bathroom Shower/Tub: Teacher, early years/pre: Standard     Home Equipment: Shower seat          Prior Functioning/Environment Level of Independence: Independent        Comments: ADLs and walks up and down the steps ten times a day for exercise        OT Problem List: Decreased strength;Decreased range of motion;Decreased activity tolerance;Impaired balance (sitting and/or standing);Decreased knowledge of use of DME or AE;Decreased knowledge of precautions;Pain      OT Treatment/Interventions: Self-care/ADL training;Therapeutic exercise;Energy conservation;DME and/or AE instruction;Therapeutic activities;Patient/family education    OT Goals(Current goals can be found in the care plan section) Acute Rehab OT Goals Patient Stated Goal: Son would like pt to return home OT Goal Formulation: With patient Time For Goal Achievement: 10/12/18 Potential to Achieve Goals: Good  OT Frequency: Min 3X/week   Barriers to D/C:            Co-evaluation PT/OT/SLP Co-Evaluation/Treatment: Yes Reason for Co-Treatment: For patient/therapist safety;To address functional/ADL transfers   OT goals addressed during session: ADL's and self-care      AM-PAC OT "6 Clicks" Daily Activity     Outcome Measure Help from another person eating meals?: None Help from another person taking care of personal grooming?: A Little Help from another person toileting, which includes using toliet, bedpan, or urinal?: A Lot Help from another person bathing (including washing, rinsing, drying)?: A Lot Help from another person to put on and taking off regular upper  body clothing?: A Lot Help from another person to put on and taking off regular lower body clothing?: A Lot 6 Click Score: 15   End of Session Nurse Communication: Mobility status;Patient requests pain meds  Activity Tolerance: Patient tolerated treatment well Patient left: in chair;with call bell/phone within reach;with family/visitor present  OT Visit Diagnosis: Unsteadiness on feet (R26.81);Other abnormalities of gait and mobility (R26.89);Muscle weakness (generalized) (M62.81);Pain Pain - Right/Left: Right Pain - part of body: (Rib)                Time: [id number] OT Time Calculation (min): 21 min Charges:  OT General Charges $OT Visit: 1 Visit OT Evaluation $OT Eval Moderate Complexity: [place], OTR/L Acute Rehab Pager: [phone] Office: [place] 09/28/2018, 12:30 PM

## 2018-09-28 NOTE — Progress Notes (Signed)
Trauma Service Note  Chief Complaint/Subjective: Pain issues overnight and this morning, some leg spasms, no new pains or complaints  Objective: Vital signs in last 24 hours: Temp:  [97.9 F (36.6 C)-99 F (37.2 C)] 98.3 F (36.8 C) (09/12 0820) Pulse Rate:  [67-90] 67 (09/12 0820) Resp:  [11-18] 15 (09/12 0820) BP: (111-184)/(58-139) 158/69 (09/12 0820) SpO2:  [91 %-100 %] 98 % (09/12 0820) Weight:  [61.2 kg] 61.2 kg (09/12 0040) Last BM Date: 09/27/18  Intake/Output from previous day: 09/11 0701 - 09/12 0700 In: 3 [I.V.:3] Out: 300 [Urine:300] Intake/Output this shift: No intake/output data recorded.  General: uncomfortable  Lungs: nonlabored  Abd: soft, NT, ND  Extremities: no edema, moves all extremities  Neuro: responds to answers appropriately with short phrases  Lab Results: CBC  Recent Labs    09/27/18 1900 09/28/18 0634  WBC 10.4 7.9  HGB 12.4 11.9*  HCT 38.4 38.1  PLT 243 253   BMET Recent Labs    09/27/18 1055 09/27/18 1900  NA 143  --   K 4.1  --   CL 104  --   CO2 27  --   GLUCOSE 111*  --   BUN 17  --   CREATININE 0.82 0.97  CALCIUM 9.1  --    PT/INR No results for input(s): LABPROT, INR in the last 72 hours. ABG No results for input(s): PHART, HCO3 in the last 72 hours.  Invalid input(s): PCO2, PO2  Studies/Results: Dg Shoulder Right  Result Date: 09/27/2018 CLINICAL DATA:  Acute RIGHT shoulder pain following fall. Initial encounter. EXAM: RIGHT SHOULDER - 2+ VIEW COMPARISON:  None. FINDINGS: There is no evidence of fracture or dislocation. There is no evidence of arthropathy or other focal bone abnormality. Soft tissues are unremarkable. IMPRESSION: Negative. Electronically Signed   By: Margarette Canada M.D.   On: 09/27/2018 15:38   Ct Chest W Contrast  Result Date: 09/27/2018 CLINICAL DATA:  Right-sided pain after fall. EXAM: CT CHEST, ABDOMEN, AND PELVIS WITH CONTRAST TECHNIQUE: Multidetector CT imaging of the chest, abdomen and  pelvis was performed following the standard protocol during bolus administration of intravenous contrast. CONTRAST:  12mL OMNIPAQUE IOHEXOL 300 MG/ML  SOLN COMPARISON:  Chest x-ray dated January 14, 2018. FINDINGS: CT CHEST FINDINGS Cardiovascular: Mild right atrial enlargement. Prominent pericardial fat. No pericardial effusion. No thoracic aortic aneurysm or dissection. Coronary, aortic arch, and branch vessel atherosclerotic vascular disease. No central pulmonary embolism. Mediastinum/Nodes: No enlarged mediastinal, hilar, or axillary lymph nodes. 8 mm hypodense nodule in the left thyroid lobe. The trachea and esophagus demonstrate no significant findings. Lungs/Pleura: Bilateral lower lobe subsegmental atelectasis. Trace right pleural effusion. No pneumothorax. No consolidation. 4 mm subpleural nodule in the anterior right upper lobe (series 6, image 71). Bronchiectasis and collapse of the right middle lobe, likely sequelae of chronic atypical infection. Musculoskeletal: Acute fractures of the right posterior and lateral seventh rib, posterior eighth rib, and posterior and lateral ninth rib. The posterior ninth rib fracture is moderately displaced. CT ABDOMEN PELVIS FINDINGS Hepatobiliary: Hepatic steatosis. Annular lesion in the gallbladder body with narrowing, wall thickening, and wall hyperenhancement. Similar appearing infiltrative mass-like foci in the adjacent liver. No biliary dilatation. Pancreas: Mild atrophy. No ductal dilatation or surrounding inflammatory changes. Spleen: No splenic injury or perisplenic hematoma. Adrenals/Urinary Tract: No adrenal hemorrhage or renal injury identified. Bladder is unremarkable. Stomach/Bowel: Stomach is within normal limits. Appendix appears normal. No evidence of bowel wall thickening, distention, or inflammatory changes. Vascular/Lymphatic: Enlarged heterogeneously enhancing portacaval  lymph node measuring 1.5 cm in short axis. Aortic atherosclerosis.  Reproductive: Uterus and bilateral adnexa are unremarkable. Other: None. Musculoskeletal: No acute or significant osseous findings. Severe right hip osteoarthritis. 8.2 cm lipoma in the right tensor fascia lata. IMPRESSION: Chest: 1. Acute fractures of the right seventh through ninth ribs. The seventh and ninth ribs are fractured in two places. 2. Trace right pleural effusion.  No pneumothorax. 3. Bronchiectasis and collapse of the right middle lobe, likely sequelae of chronic atypical infection. 4.  Aortic atherosclerosis (ICD10-I70.0). Abdomen and pelvis: 1. Annular lesion in the gallbladder body with narrowing, wall thickening, and wall hyperenhancement. Similar appearing infiltrative mass-like foci in the adjacent liver. 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 2. Enlarged heterogeneously enhancing portacaval lymph node, concerning for nodal metastasis. Electronically Signed   By: Titus Dubin M.D.   On: 09/27/2018 13:33   Ct Abdomen Pelvis W Contrast  Result Date: 09/27/2018 CLINICAL DATA:  Right-sided pain after fall. EXAM: CT CHEST, ABDOMEN, AND PELVIS WITH CONTRAST TECHNIQUE: Multidetector CT imaging of the chest, abdomen and pelvis was performed following the standard protocol during bolus administration of intravenous contrast. CONTRAST:  14mL OMNIPAQUE IOHEXOL 300 MG/ML  SOLN COMPARISON:  Chest x-ray dated January 14, 2018. FINDINGS: CT CHEST FINDINGS Cardiovascular: Mild right atrial enlargement. Prominent pericardial fat. No pericardial effusion. No thoracic aortic aneurysm or dissection. Coronary, aortic arch, and branch vessel atherosclerotic vascular disease. No central pulmonary embolism. Mediastinum/Nodes: No enlarged mediastinal, hilar, or axillary lymph nodes. 8 mm hypodense nodule in the left thyroid lobe. The trachea and esophagus demonstrate no significant findings. Lungs/Pleura: Bilateral lower lobe subsegmental atelectasis. Trace right  pleural effusion. No pneumothorax. No consolidation. 4 mm subpleural nodule in the anterior right upper lobe (series 6, image 71). Bronchiectasis and collapse of the right middle lobe, likely sequelae of chronic atypical infection. Musculoskeletal: Acute fractures of the right posterior and lateral seventh rib, posterior eighth rib, and posterior and lateral ninth rib. The posterior ninth rib fracture is moderately displaced. CT ABDOMEN PELVIS FINDINGS Hepatobiliary: Hepatic steatosis. Annular lesion in the gallbladder body with narrowing, wall thickening, and wall hyperenhancement. Similar appearing infiltrative mass-like foci in the adjacent liver. No biliary dilatation. Pancreas: Mild atrophy. No ductal dilatation or surrounding inflammatory changes. Spleen: No splenic injury or perisplenic hematoma. Adrenals/Urinary Tract: No adrenal hemorrhage or renal injury identified. Bladder is unremarkable. Stomach/Bowel: Stomach is within normal limits. Appendix appears normal. No evidence of bowel wall thickening, distention, or inflammatory changes. Vascular/Lymphatic: Enlarged heterogeneously enhancing portacaval lymph node measuring 1.5 cm in short axis. Aortic atherosclerosis. Reproductive: Uterus and bilateral adnexa are unremarkable. Other: None. Musculoskeletal: No acute or significant osseous findings. Severe right hip osteoarthritis. 8.2 cm lipoma in the right tensor fascia lata. IMPRESSION: Chest: 1. Acute fractures of the right seventh through ninth ribs. The seventh and ninth ribs are fractured in two places. 2. Trace right pleural effusion.  No pneumothorax. 3. Bronchiectasis and collapse of the right middle lobe, likely sequelae of chronic atypical infection. 4.  Aortic atherosclerosis (ICD10-I70.0). Abdomen and pelvis: 1. Annular lesion in the gallbladder body with narrowing, wall thickening, and wall hyperenhancement. Similar appearing infiltrative mass-like foci in the adjacent liver. 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 2. Enlarged heterogeneously enhancing portacaval lymph node, concerning for nodal metastasis. Electronically Signed   By: Titus Dubin M.D.   On: 09/27/2018 13:33   Mr 3d Recon At Scanner  Result Date: 09/28/2018 CLINICAL DATA:  83 year old  female with history of right-sided abdominal pain after a fall. Abnormal CT demonstrating potential gallbladder mass. Follow-up study. EXAM: MRI ABDOMEN WITHOUT AND WITH CONTRAST (INCLUDING MRCP) TECHNIQUE: Multiplanar multisequence MR imaging of the abdomen was performed both before and after the administration of intravenous contrast. Heavily T2-weighted images of the biliary and pancreatic ducts were obtained, and three-dimensional MRCP images were rendered by post processing. CONTRAST:  61mL GADAVIST GADOBUTROL 1 MMOL/ML IV SOLN COMPARISON:  No prior abdominal MRI. CT the abdomen and pelvis 09/27/2018. FINDINGS: Comment: Portions of today's examination are limited by considerable patient respiratory motion. Lower chest: Cardiomegaly. Trace right pleural effusion lying dependently. Hepatobiliary: Diffuse loss of signal intensity throughout the hepatic parenchyma on out of phase dual echo images, indicative of hepatic steatosis. Within the central aspect of the liver predominantly in segments 4A/B, segment 5 and segment 8 there are multiple T1 I so to slightly hypointense, T2 hyperintense, centrally hypovascular peripherally enhancing lesions which demonstrate diffusion restriction. The largest of these measures up to 2.2 cm (axial image 18 of series 8). No intra or extrahepatic biliary ductal dilatation noted on MRCP images. Common bile duct measures 7 mm in the porta hepatis (within normal limits for the patient's age). No filling defects within the common bile duct to suggest choledocholithiasis. However, there is extensive mural thickening in the proximal to mid gallbladder which demonstrates  enhancement on post gadolinium images, concerning for gallbladder neoplasm. No filling defects in the gallbladder to suggest the presence of cholelithiasis. Pancreas: No pancreatic mass. No pancreatic ductal dilatation noted on MRCP images. No pancreatic or peripancreatic fluid collections or inflammatory changes. Spleen:  Unremarkable. Adrenals/Urinary Tract: Subcentimeter T1 hypointense, T2 hyperintense, nonenhancing lesion in the lower pole of the right kidney, compatible with a tiny simple cyst. Left kidney and bilateral adrenal glands are normal in appearance. No hydroureteronephrosis in the visualized portions of the abdomen. Stomach/Bowel: Visualized portions are unremarkable. Vascular/Lymphatic: Aortic atherosclerosis, without evidence of aneurysm in the abdominal vasculature. Mildly enlarged 1.2 cm short axis hepatic duodenal ligament lymph node which demonstrates diffusion restriction (axial image 83 of series 9) and heterogeneous enhancement, concerning for metastatic lymph node. Other: No significant volume of ascites noted in the visualized portions of the peritoneal cavity. Musculoskeletal: No aggressive appearing osseous lesions are noted in the visualized portions of the skeleton. IMPRESSION: 1. Asymmetric mural thickening and enhancement in the gallbladder wall highly concerning for primary gallbladder neoplasm. Multiple malignant appearing lesions noted in the adjacent central aspect of the liver, highly concerning for multifocal hepatic metastasis. In addition, there is an enlarged enhancing hepatic duodenal ligament lymph node which demonstrates diffusion restriction, which likely represents a local nodal metastasis. 2. Cardiomegaly. 3. Trace right pleural effusion. Electronically Signed   By: Vinnie Langton M.D.   On: 09/28/2018 04:24   Dg Chest Port 1 View  Result Date: 09/28/2018 CLINICAL DATA:  Assess cardiopulmonary status. Back pain, rib fracture. Recent fall. EXAM: PORTABLE CHEST 1  VIEW COMPARISON:  01/14/2018 and chest CT 09/27/2018 FINDINGS: Lungs are adequately inflated demonstrate new small to moderate left effusion likely with associated basilar atelectasis. Subtle linear atelectasis right base. Mild cardiomegaly. Known acute right posterior sixth and seventh rib fractures. IMPRESSION: New small to moderate left pleural effusion likely with associated basilar atelectasis. Stable cardiomegaly. Known acute right sixth and seventh rib fractures. Electronically Signed   By: Marin Olp M.D.   On: 09/28/2018 10:15   Mr Abdomen Mrcp Moise Boring Contast  Result Date: 09/28/2018 CLINICAL DATA:  83 year old female  with history of right-sided abdominal pain after a fall. Abnormal CT demonstrating potential gallbladder mass. Follow-up study. EXAM: MRI ABDOMEN WITHOUT AND WITH CONTRAST (INCLUDING MRCP) TECHNIQUE: Multiplanar multisequence MR imaging of the abdomen was performed both before and after the administration of intravenous contrast. Heavily T2-weighted images of the biliary and pancreatic ducts were obtained, and three-dimensional MRCP images were rendered by post processing. CONTRAST:  7mL GADAVIST GADOBUTROL 1 MMOL/ML IV SOLN COMPARISON:  No prior abdominal MRI. CT the abdomen and pelvis 09/27/2018. FINDINGS: Comment: Portions of today's examination are limited by considerable patient respiratory motion. Lower chest: Cardiomegaly. Trace right pleural effusion lying dependently. Hepatobiliary: Diffuse loss of signal intensity throughout the hepatic parenchyma on out of phase dual echo images, indicative of hepatic steatosis. Within the central aspect of the liver predominantly in segments 4A/B, segment 5 and segment 8 there are multiple T1 I so to slightly hypointense, T2 hyperintense, centrally hypovascular peripherally enhancing lesions which demonstrate diffusion restriction. The largest of these measures up to 2.2 cm (axial image 18 of series 8). No intra or extrahepatic biliary ductal  dilatation noted on MRCP images. Common bile duct measures 7 mm in the porta hepatis (within normal limits for the patient's age). No filling defects within the common bile duct to suggest choledocholithiasis. However, there is extensive mural thickening in the proximal to mid gallbladder which demonstrates enhancement on post gadolinium images, concerning for gallbladder neoplasm. No filling defects in the gallbladder to suggest the presence of cholelithiasis. Pancreas: No pancreatic mass. No pancreatic ductal dilatation noted on MRCP images. No pancreatic or peripancreatic fluid collections or inflammatory changes. Spleen:  Unremarkable. Adrenals/Urinary Tract: Subcentimeter T1 hypointense, T2 hyperintense, nonenhancing lesion in the lower pole of the right kidney, compatible with a tiny simple cyst. Left kidney and bilateral adrenal glands are normal in appearance. No hydroureteronephrosis in the visualized portions of the abdomen. Stomach/Bowel: Visualized portions are unremarkable. Vascular/Lymphatic: Aortic atherosclerosis, without evidence of aneurysm in the abdominal vasculature. Mildly enlarged 1.2 cm short axis hepatic duodenal ligament lymph node which demonstrates diffusion restriction (axial image 83 of series 9) and heterogeneous enhancement, concerning for metastatic lymph node. Other: No significant volume of ascites noted in the visualized portions of the peritoneal cavity. Musculoskeletal: No aggressive appearing osseous lesions are noted in the visualized portions of the skeleton. IMPRESSION: 1. Asymmetric mural thickening and enhancement in the gallbladder wall highly concerning for primary gallbladder neoplasm. Multiple malignant appearing lesions noted in the adjacent central aspect of the liver, highly concerning for multifocal hepatic metastasis. In addition, there is an enlarged enhancing hepatic duodenal ligament lymph node which demonstrates diffusion restriction, which likely represents a  local nodal metastasis. 2. Cardiomegaly. 3. Trace right pleural effusion. Electronically Signed   By: Vinnie Langton M.D.   On: 09/28/2018 04:24    Anti-infectives: Anti-infectives (From admission, onward)   None      Medications Scheduled Meds: . docusate sodium  100 mg Oral BID  . enoxaparin (LOVENOX) injection  40 mg Subcutaneous Q24H  . ketorolac  15 mg Intravenous Q8H  . methocarbamol  500 mg Oral TID  . pantoprazole  40 mg Oral Daily   Or  . pantoprazole (PROTONIX) IV  40 mg Intravenous Daily  . sodium chloride flush  3 mL Intravenous Q12H   Continuous Infusions: . sodium chloride     PRN Meds:.sodium chloride, hydrALAZINE, HYDROcodone-acetaminophen, morphine injection, ondansetron **OR** ondansetron (ZOFRAN) IV, oxyCODONE, sodium chloride flush  Assessment/Plan: Fall  R 7-9 Rib fractures: pain control, IS Incidental  finding of gallbladder mass with MRI showing multiple concerning masses of liver - plan for  Hospital consult next week  FEN- advance to reg diet VTE- lovenox, SCDs ID- none Dispo- PT/OT needs work due to pain from ribs, continue to work on breathing   LOS: 1 day   Roy Surgeon [phone] Surgery 09/28/2018

## 2018-09-28 NOTE — Evaluation (Signed)
Physical Therapy Evaluation Patient Details Name: Natalie Walls MRN: [id number] DOB: November 17, 1935 Today's Date: 09/28/2018   History of Present Illness  44 female presenting with right 7-9 rib fxs post fall. Incidental finding of gallbladder mass with MRI showing mutiple concerning masses of liver. PMH including pericardial effusion, HTN, and asthma.  Clinical Impression  Pt admitted with above. Pt presents with decreased functional mobility secondary to significant pain, weakness, decreased activity tolerance. Requiring maximal assist for bed mobility and transferring to chair with moderate assist. SpO2 92-97% on RA. Needs encouragement and education on importance of mobility. Prior to admission, pt ambulatory and independent with ADL's. Pt son very supportive and will provide 24/7 assist. Will follow acutely to provide pt/family education in addition to transfer and gait training. Recommending HHPT at discharge.     Follow Up Recommendations Home health PT;Supervision/Assistance - 24 hour    Equipment Recommendations  Rolling walker with 5" wheels;3in1 (PT)    Recommendations for Other Services       Precautions / Restrictions Precautions Precautions: Other (comment);Fall(Right rib fxs) Restrictions Weight Bearing Restrictions: No      Mobility  Bed Mobility Overal bed mobility: Needs Assistance Bed Mobility: Rolling;Sidelying to Sit Rolling: Max assist;+2 for physical assistance Sidelying to sit: Max assist;+2 for physical assistance       General bed mobility comments: Max A +2 for log roll to left side while splinting with pillow. Pt with decreased participation due to significant pain  Transfers Overall transfer level: Needs assistance Equipment used: None Transfers: Stand Pivot Transfers   Stand pivot transfers: Mod assist;+2 safety/equipment Squat pivot transfers: Max assist;+2 safety/equipment     General transfer comment: ModA to pivot towards right to recliner  with +2 for safety. Able to take pivotal steps, holding onto pillow  Ambulation/Gait                Stairs            Wheelchair Mobility    Modified Rankin (Stroke Patients Only)       Balance Overall balance assessment: Needs assistance Sitting-balance support: No upper extremity supported;Feet supported Sitting balance-Leahy Scale: Poor     Standing balance support: No upper extremity supported;During functional activity Standing balance-Leahy Scale: Poor                               Pertinent Vitals/Pain Pain Assessment: Faces Faces Pain Scale: Hurts whole lot Pain Location: Ribs Pain Descriptors / Indicators: Constant;Discomfort;Grimacing;Moaning Pain Intervention(s): Monitored during session;Limited activity within patient's tolerance;Repositioned    Home Living Family/patient expects to be discharged to:: Private residence Living Arrangements: Children(Son) Available Help at Discharge: Family;Available 24 hours/day Type of Home: House Home Access: Stairs to enter     Home Layout: Two level;Able to live on main level with bedroom/bathroom;Bed/bath upstairs Home Equipment: Shower seat      Prior Function Level of Independence: Independent         Comments: ADLs and walks up and down the steps ten times a day for exercise     Hand Dominance        Extremity/Trunk Assessment   Upper Extremity Assessment Upper Extremity Assessment: Generalized weakness    Lower Extremity Assessment Lower Extremity Assessment: Generalized weakness    Cervical / Trunk Assessment Cervical / Trunk Assessment: Other exceptions Cervical / Trunk Exceptions: R ribs  Communication   Communication: Prefers language other than English(Vietnamese; stratus interpreter Khat [id number])  Cognition Arousal/Alertness: Lethargic;Suspect due to medications(difficult to assess with language barrier) Behavior During Therapy: [facility] for tasks  assessed/performed Overall Cognitive Status: Difficult to assess                                        General Comments General comments (skin integrity, edema, etc.): Son present throughout session. VSS    Exercises     Assessment/Plan    PT Assessment Patient needs continued PT services  PT Problem List Decreased strength;Decreased activity tolerance;Decreased balance;Decreased mobility;Pain       PT Treatment Interventions DME instruction;Gait training;Stair training;Functional mobility training;Therapeutic activities;Therapeutic exercise;Balance training;Patient/family education    PT Goals (Current goals can be found in the Care Plan section)  Acute Rehab PT Goals Patient Stated Goal: Son would like pt to return home PT Goal Formulation: With patient Time For Goal Achievement: 10/12/18 Potential to Achieve Goals: Good    Frequency Min 3X/week   Barriers to discharge        Co-evaluation PT/OT/SLP Co-Evaluation/Treatment: Yes Reason for Co-Treatment: For patient/therapist safety;To address functional/ADL transfers PT goals addressed during session: Mobility/safety with mobility OT goals addressed during session: ADL's and self-care       AM-PAC PT "6 Clicks" Mobility  Outcome Measure Help needed turning from your back to your side while in a flat bed without using bedrails?: Total Help needed moving from lying on your back to sitting on the side of a flat bed without using bedrails?: Total Help needed moving to and from a bed to a chair (including a wheelchair)?: A Lot Help needed standing up from a chair using your arms (e.g., wheelchair or bedside chair)?: A Lot Help needed to walk in hospital room?: A Lot Help needed climbing 3-5 steps with a railing? : Total 6 Click Score: 9    End of Session   Activity Tolerance: Patient limited by pain Patient left: in chair;with call bell/phone within reach;with family/visitor present Nurse  Communication: Mobility status PT Visit Diagnosis: Pain;Difficulty in walking, not elsewhere classified (R26.2) Pain - part of body: (right flank)    Time: [id number] PT Time Calculation (min) (ACUTE ONLY): 35 min   Charges:   PT Evaluation $PT Eval Moderate Complexity: 1 Mod          Ellamae Sia, PT, DPT Acute Rehabilitation Services Pager [phone] Office [phone]   Willy Eddy 09/28/2018, 1:35 PM

## 2018-09-29 MED ORDER — TRAMADOL HCL 50 MG PO TABS
50.0000 mg | ORAL_TABLET | Freq: Four times a day (QID) | ORAL | Status: DC
  Administered 2018-09-29 – 2018-09-30 (×5): 50 mg via ORAL
  Filled 2018-09-29 (×5): qty 1

## 2018-09-29 NOTE — Progress Notes (Signed)
Patient ID: Natalie Walls, female   DOB: Nov 15, 1935, 83 y.o.   MRN: [id number]     Subjective: R rib pain, RN reports low U/O  Objective: Vital signs in last 24 hours: Temp:  [98.3 F (36.8 C)-99.1 F (37.3 C)] 98.5 F (36.9 C) (09/13 0745) Pulse Rate:  [72-91] 87 (09/13 0745) Resp:  [12-20] 20 (09/13 0745) BP: (131-153)/(63-92) 139/67 (09/13 0745) SpO2:  [90 %-98 %] 97 % (09/13 0745) Last BM Date: 09/27/18  Intake/Output from previous day: 09/12 0701 - 09/13 0700 In: 51 [P.O.:460] Out: -  Intake/Output this shift: Total I/O In: 100 [P.O.:100] Out: -   General appearance: alert and cooperative Resp: clear to auscultation bilaterally Chest wall: right sided chest wall tenderness Cardio: regular rate and rhythm GI: soft, non-tender; bowel sounds normal; no masses,  no organomegaly Extremities: no edema  Lab Results: CBC  Recent Labs    09/27/18 1900 09/28/18 0634  WBC 10.4 7.9  HGB 12.4 11.9*  HCT 38.4 38.1  PLT 243 253   BMET Recent Labs    09/27/18 1055 09/27/18 1900  NA 143  --   K 4.1  --   CL 104  --   CO2 27  --   GLUCOSE 111*  --   BUN 17  --   CREATININE 0.82 0.97  CALCIUM 9.1  --    Assessment/Plan: Fall R 7-9 Rib fractures: pain control, IS almost 750 today Incidental finding of gallbladder mass with MRI showing multiple concerning masses of liver - plan for [facility] consult tomorrow, CMET in AM  FEN- reg diet, encourage PO, add Ultram scheduled, robaxin PRN VTE- lovenox, SCDs ID- none  Dispo- PT/OT I spoke with both of her sons and her using the iPad interpreter. She lives with them. Hopefully home with them next 1-2d  LOS: 2 days    Georganna Skeans, MD, MPH, [facility]: [phone]  09/29/2018 interpreter ipad

## 2018-09-29 NOTE — Progress Notes (Signed)
Translator used- RN woke patient up, family at bedside, RN stated concerned there was no urine in the purewick device, family stated she went in bed. Laid patient back to check pads, pads were dry, pt telling translator she couldn't breathe, pt very anxious, crying, grabbing right side, pt also in pain. Medicated per MAR. After pt's pain relieved, readjusted purewick, pt stated she didn't want to get up to go the bathroom or BSC. Pt sitting up in bed, WCTM.

## 2018-09-29 NOTE — Progress Notes (Signed)
RN inform RT that pt refused to use hospital cpap machine and that she will have a family member bring in her machine from home.

## 2018-09-29 NOTE — Plan of Care (Signed)
  Problem: Education: Goal: Knowledge of General Education information will improve Description: Including pain rating scale, medication(s)/side effects and non-pharmacologic comfort measures Outcome: Progressing   Problem: Health Behavior/Discharge Planning: Goal: Ability to manage health-related needs will improve Outcome: Progressing   Problem: Clinical Measurements: Goal: Diagnostic test results will improve Outcome: Progressing   Problem: Activity: Goal: Risk for activity intolerance will decrease Outcome: Progressing   Problem: Nutrition: Goal: Adequate nutrition will be maintained Outcome: Progressing   Problem: Elimination: Goal: Will not experience complications related to bowel motility Outcome: Progressing Goal: Will not experience complications related to urinary retention Outcome: Progressing   Problem: Pain Managment: Goal: General experience of comfort will improve Outcome: Progressing   Problem: Safety: Goal: Ability to remain free from injury will improve Outcome: Progressing   

## 2018-09-29 NOTE — Progress Notes (Signed)
Around 1600 patient awoke in pain crying out about her right side. Son present to interpret. Medicated for  Pain and nausea. Attempted to reposition but patient cries out and fights it. Not taking po as well this afternoon as this am, little urine output. Purewick in place. IS to only about 500. Simmie Davies RN

## 2018-09-30 ENCOUNTER — Telehealth: Payer: Self-pay | Admitting: Oncology

## 2018-09-30 LAB — CBC
HCT: 37.1 % (ref 36.0–46.0)
Hemoglobin: 11.8 g/dL — ABNORMAL LOW (ref 12.0–15.0)
MCH: 30.8 pg (ref 26.0–34.0)
MCHC: 31.8 g/dL (ref 30.0–36.0)
MCV: 96.9 fL (ref 80.0–100.0)
Platelets: 235 10*3/uL (ref 150–400)
RBC: 3.83 MIL/uL — ABNORMAL LOW (ref 3.87–5.11)
RDW: 13.5 % (ref 11.5–15.5)
WBC: 10.4 10*3/uL (ref 4.0–10.5)
nRBC: 0 % (ref 0.0–0.2)

## 2018-09-30 LAB — COMPREHENSIVE METABOLIC PANEL
ALT: 25 U/L (ref 0–44)
AST: 29 U/L (ref 15–41)
Albumin: 2.7 g/dL — ABNORMAL LOW (ref 3.5–5.0)
Alkaline Phosphatase: 74 U/L (ref 38–126)
Anion gap: 8 (ref 5–15)
BUN: 36 mg/dL — ABNORMAL HIGH (ref 8–23)
CO2: 27 mmol/L (ref 22–32)
Calcium: 8.5 mg/dL — ABNORMAL LOW (ref 8.9–10.3)
Chloride: 100 mmol/L (ref 98–111)
Creatinine, Ser: 1.06 mg/dL — ABNORMAL HIGH (ref 0.44–1.00)
GFR calc Af Amer: 57 mL/min — ABNORMAL LOW (ref >60)
GFR calc non Af Amer: 49 mL/min — ABNORMAL LOW (ref >60)
Glucose, Bld: 119 mg/dL — ABNORMAL HIGH (ref 70–99)
Potassium: 4.4 mmol/L (ref 3.5–5.1)
Sodium: 135 mmol/L (ref 135–145)
Total Bilirubin: 0.9 mg/dL (ref 0.3–1.2)
Total Protein: 6.3 g/dL — ABNORMAL LOW (ref 6.5–8.1)

## 2018-09-30 MED ORDER — DOCUSATE SODIUM 100 MG PO CAPS: 100.0000 mg | ORAL_CAPSULE | Freq: Two times a day (BID) | ORAL | 0 refills | Status: AC

## 2018-09-30 MED ORDER — TRAMADOL HCL 50 MG PO TABS: 50.0000 mg | ORAL_TABLET | Freq: Four times a day (QID) | ORAL | 0 refills | Status: AC | PRN

## 2018-09-30 MED ORDER — HYDROCODONE-ACETAMINOPHEN 5-325 MG PO TABS: 1.0000 | ORAL_TABLET | ORAL | Status: DC | PRN

## 2018-09-30 MED ORDER — METHOCARBAMOL 500 MG PO TABS: 500.0000 mg | ORAL_TABLET | Freq: Three times a day (TID) | ORAL | 0 refills | Status: AC

## 2018-09-30 MED ORDER — HYDROCODONE-ACETAMINOPHEN 5-325 MG PO TABS: 1.0000 | ORAL_TABLET | ORAL | 0 refills | Status: AC | PRN

## 2018-09-30 NOTE — Progress Notes (Signed)
Occupational Therapy Treatment Patient Details Name: Natalie Walls MRN: [id number] DOB: October 20, 1935 Today's Date: 09/30/2018    History of present illness 25 female presenting with right 7-9 rib fxs post fall. Incidental finding of gallbladder mass with MRI showing mutiple concerning masses of liver. PMH including pericardial effusion, HTN, and asthma.   OT comments  Pt progressing towards established OT goals. Pt performing toileting with Min A for safety in transfer and to manage clothing. Pt performing hand hygiene and functional mobility in hallway with RW and Min Guard A. Pt with decreased occupational performance and participation due to pain at right ribs (radiating to back with increased activity). Educated pt and son on importance of mobility at home and use of IS; both verbalized understanding. Continue to recommend dc to home with HHOT and will continue to follow acutely as admitted.    Follow Up Recommendations  Home health OT;Supervision/Assistance - 24 hour    Equipment Recommendations  None recommended by OT    Recommendations for Other Services PT consult    Precautions / Restrictions Precautions Precautions: Fall Precaution Comments: watch 02 Restrictions Weight Bearing Restrictions: No       Mobility Bed Mobility Overal bed mobility: Needs Assistance Bed Mobility: Supine to Sit     Supine to sit: Mod assist;+2 for safety/equipment     General bed mobility comments: Pt pulling up on therapist's hands to elevate trunk and scoot bottom to EOB.  Transfers Overall transfer level: Needs assistance Equipment used: Rolling walker (2 wheeled) Transfers: Sit to/from Stand Sit to Stand: Min assist;+2 safety/equipment         General transfer comment: Min A for power into stanidng    Balance Overall balance assessment: Needs assistance Sitting-balance support: No upper extremity supported;Feet supported Sitting balance-Leahy Scale: Fair     Standing  balance support: No upper extremity supported;During functional activity Standing balance-Leahy Scale: Fair                             ADL either performed or assessed with clinical judgement   ADL Overall ADL's : Needs assistance/impaired     Grooming: Wash/dry hands;Min guard;Standing Grooming Details (indicate cue type and reason): Min Guard A for safety in standing                 Toilet Transfer: Minimal assistance;+2 for safety/equipment;Ambulation;Regular Glass blower/designer Details (indicate cue type and reason): Min A for safety with descent as pt presented with urgency and moved quickly Toileting- Clothing Manipulation and Hygiene: Minimal assistance;Sitting/lateral lean Toileting - Clothing Manipulation Details (indicate cue type and reason): Min A for managing gown     Functional mobility during ADLs: Min guard;Rolling walker General ADL Comments: Pt presenting with increased activity tolerance. Rerquiring increased cues for encouragement     Vision       Perception     Praxis      Cognition Arousal/Alertness: Awake/alert Behavior During Therapy: Flat affect Overall Cognitive Status: Difficult to assess                                          Exercises Exercises: Other exercises Other Exercises Other Exercises: Educating pt and son on use of IS. pt demonstrating understanding.    Shoulder Instructions       General Comments Son present throughout. Use of vietnamese interpreter  Dorian Furnace [id number]. SpO2 >91% on RA    Pertinent Vitals/ Pain       Pain Assessment: Faces Faces Pain Scale: Hurts whole lot Pain Location: Ribs, back Pain Descriptors / Indicators: Discomfort;Grimacing Pain Intervention(s): Monitored during session;Repositioned  Home Living                                          Prior Functioning/Environment              Frequency  Min 3X/week        Progress Toward  Goals  OT Goals(current goals can now be found in the care plan section)  Progress towards OT goals: Progressing toward goals  Acute Rehab OT Goals Patient Stated Goal: Son would like pt to return home OT Goal Formulation: With patient Time For Goal Achievement: 10/12/18 Potential to Achieve Goals: Good ADL Goals Pt Will Perform Upper Body Dressing: with set-up;with supervision;with caregiver independent in assisting;sitting Pt Will Perform Lower Body Dressing: with set-up;with supervision;with caregiver independent in assisting;sit to/from stand Pt Will Transfer to Toilet: with set-up;with supervision;ambulating;bedside commode;squat pivot transfer Pt Will Perform Toileting - Clothing Manipulation and hygiene: with supervision;with caregiver independent in assisting;sit to/from stand Additional ADL Goal #1: Pt will perform bed mobility with Min Guard A in preparation for ADLs  Plan Discharge plan remains appropriate    Co-evaluation    PT/OT/SLP Co-Evaluation/Treatment: Yes Reason for Co-Treatment: Complexity of the patient's impairments (multi-system involvement);To address functional/ADL transfers;Necessary to address cognition/behavior during functional activity;For patient/therapist safety PT goals addressed during session: Mobility/safety with mobility;Balance;Proper use of DME OT goals addressed during session: ADL's and self-care      AM-PAC OT "6 Clicks" Daily Activity     Outcome Measure   Help from another person eating meals?: None Help from another person taking care of personal grooming?: A Little Help from another person toileting, which includes using toliet, bedpan, or urinal?: A Little Help from another person bathing (including washing, rinsing, drying)?: A Lot Help from another person to put on and taking off regular upper body clothing?: A Lot Help from another person to put on and taking off regular lower body clothing?: A Lot 6 Click Score: 16    End of  Session Equipment Utilized During Treatment: Rolling walker  OT Visit Diagnosis: Unsteadiness on feet (R26.81);Other abnormalities of gait and mobility (R26.89);Muscle weakness (generalized) (M62.81);Pain Pain - Right/Left: Right Pain - part of body: (Rib)   Activity Tolerance Patient tolerated treatment well   Patient Left in chair;with call bell/phone within reach;with family/visitor present   Nurse Communication Mobility status        Time: [id number] OT Time Calculation (min): 31 min  Charges: OT General Charges $OT Visit: 1 Visit OT Treatments $Self Care/Home Management : 8-22 mins  Nicholson, OTR/L Acute Rehab Pager: [phone] Office: [place] 09/30/2018, 9:31 AM

## 2018-09-30 NOTE — Telephone Encounter (Signed)
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 Natalie Walls has been scheduled to see Dr. Benay Spice on 9/21 at 2pm. Claiborne Billings will have the appt date and time on the pt's discharge summary. I informed Natalie Walls that our facility isn't allowing visitors but will provide an interpreter for the pt. Voiced understanding and will have this relayed to the pt and her son.

## 2018-09-30 NOTE — Progress Notes (Signed)
[facility] Progress Note     Subjective: CC: pain Pain on the right side. Some SOB but not requiring supplemental O2. Pain and SOB generally only occurring with movement. Denies abdominal pain or nausea. Urine output improved from yesterday. No BM since Friday.   Objective: Vital signs in last 24 hours: Temp:  [98.2 F (36.8 C)-99.1 F (37.3 C)] 98.8 F (37.1 C) (09/14 0751) Pulse Rate:  [66-81] 66 (09/14 0751) Resp:  [18-20] 18 (09/14 0409) BP: (107-160)/(53-88) 115/65 (09/14 0751) SpO2:  [97 %-100 %] 100 % (09/14 0751) Last BM Date: 09/27/18  Intake/Output from previous day: 09/13 0701 - 09/14 0700 In: 593 [P.O.:590; I.V.:3] Out: 400 [Urine:400] Intake/Output this shift: No intake/output data recorded.  PE: Gen:  Alert, appears uncomfortable Card:  Regular rate and rhythm Pulm:  Normal effort, clear to auscultation bilaterally, pulled 750 on IS Abd: Soft, non-tender, non-distended, +BS Skin: warm and dry, no rashes  Psych: A&Ox3   Lab Results:  Recent Labs    09/28/18 0634 09/30/18 0336  WBC 7.9 10.4  HGB 11.9* 11.8*  HCT 38.1 37.1  PLT 253 235   BMET Recent Labs    09/27/18 1055 09/27/18 1900 09/30/18 0336  NA 143  --  135  K 4.1  --  4.4  CL 104  --  100  CO2 27  --  27  GLUCOSE 111*  --  119*  BUN 17  --  36*  CREATININE 0.82 0.97 1.06*  CALCIUM 9.1  --  8.5*   PT/INR No results for input(s): LABPROT, INR in the last 72 hours. CMP     Component Value Date/Time   NA 135 09/30/2018 0336   K 4.4 09/30/2018 0336   CL 100 09/30/2018 0336   CO2 27 09/30/2018 0336   GLUCOSE 119 (H) 09/30/2018 0336   BUN 36 (H) 09/30/2018 0336   CREATININE 1.06 (H) 09/30/2018 0336   CALCIUM 8.5 (L) 09/30/2018 0336   PROT 6.3 (L) 09/30/2018 0336   ALBUMIN 2.7 (L) 09/30/2018 0336   AST 29 09/30/2018 0336   ALT 25 09/30/2018 0336   ALKPHOS 74 09/30/2018 0336   BILITOT 0.9 09/30/2018 0336   GFRNONAA 49 (L) 09/30/2018 0336   GFRAA 57 (L) 09/30/2018  0336   Lipase     Component Value Date/Time   LIPASE 35 09/27/2018 1055       Studies/Results: No results found.  Anti-infectives: Anti-infectives (From admission, onward)   None       Assessment/Plan Fall R 7-9 Rib fractures: pain control, IS almost 750 today Incidental finding of gallbladder mass with MRI showing multiple concerning masses of liver - LFTs within normal limits, Discussed with Dr. Barry Dienes who would not recommend surgical resection at this time. Will consult medical oncology for outpatient follow up.  AKI - stop toradol, Cr 1.06 from 0.82 on admit  FEN- reg diet, encourage PO, add Ultram scheduled, robaxin PRN VTE- lovenox, SCDs ID- none  Dispo- Likely home later today  I spoke with one of her sons and her using the iPad interpreter. She lives with them  LOS: 3 days    Brigid Re , [facility] 09/30/2018, 10:21 AM Pager: [phone]

## 2018-09-30 NOTE — Progress Notes (Signed)
Physical Therapy Treatment Patient Details Name: Natalie Walls MRN: [id number] DOB: 1936/01/12 Today's Date: 09/30/2018    History of Present Illness 63 female presenting with right 7-9 rib fxs post fall. Incidental finding of gallbladder mass with MRI showing mutiple concerning masses of liver. PMH including pericardial effusion, HTN, and asthma.    PT Comments    Patient progressing well towards PT goals. Tolerated gait training with min guard assist for balance/safety. Occasionally needed Min A for RW management when in small spaces re: bathroom. Pt reports pain with deep breaths and movement. Sp02 remained >90% on RA. Increased time to perform all mobility. Encouraged use of IS- practicing at end of session with son present in room. Per son, pt will be able to stay on first level of home at d/c. Will continue to follow for stair negotiation as tolerated.     Follow Up Recommendations  Home health PT;Supervision/Assistance - 24 hour     Equipment Recommendations  Rolling walker with 5" wheels    Recommendations for Other Services       Precautions / Restrictions Precautions Precautions: Fall Precaution Comments: watch 02 Restrictions Weight Bearing Restrictions: No    Mobility  Bed Mobility Overal bed mobility: Needs Assistance Bed Mobility: Supine to Sit     Supine to sit: Mod assist;+2 for safety/equipment     General bed mobility comments: Pt pulling up on therapist's hands to elevate trunk and scoot bottom to EOB despite cues to reach for rail.  Transfers Overall transfer level: Needs assistance Equipment used: Rolling walker (2 wheeled) Transfers: Sit to/from Stand Sit to Stand: Min assist;+2 safety/equipment         General transfer comment: Min A for power into standing from EOB x1, from toilet x1.  Ambulation/Gait Ambulation/Gait assistance: Min guard;Min assist Gait Distance (Feet): 80 Feet(+20') Assistive device: Rolling walker (2 wheeled) Gait  Pattern/deviations: Step-through pattern;Decreased stride length Gait velocity: decreased   General Gait Details: Slow, guarded gait with RW for support, assist with RW management esp in small spaces. Reports pain with deep breaths.   Stairs             Wheelchair Mobility    Modified Rankin (Stroke Patients Only)       Balance Overall balance assessment: Needs assistance Sitting-balance support: No upper extremity supported;Feet supported Sitting balance-Leahy Scale: Fair     Standing balance support: During functional activity;Bilateral upper extremity supported Standing balance-Leahy Scale: Fair Standing balance comment: Able to stand at sink and wash hands with Min A, requires UE support for walking.                            Cognition Arousal/Alertness: Awake/alert Behavior During Therapy: Flat affect Overall Cognitive Status: Difficult to assess                                        Exercises Other Exercises Other Exercises: Educating pt and son on use of IS. pt demonstrating understanding.     General Comments General comments (skin integrity, edema, etc.): Used of [place] interpreter Truyen [phone], Sp02 >90% on RA.      Pertinent Vitals/Pain Pain Assessment: Faces Faces Pain Scale: Hurts whole lot Pain Location: Ribs, back Pain Descriptors / Indicators: Discomfort;Grimacing Pain Intervention(s): Monitored during session;Repositioned    Home Living  Prior Function            PT Goals (current goals can now be found in the care plan section) Acute Rehab PT Goals Patient Stated Goal: Son would like pt to return home Progress towards PT goals: Progressing toward goals    Frequency    Min 3X/week      PT Plan Current plan remains appropriate    Co-evaluation PT/OT/SLP Co-Evaluation/Treatment: Yes Reason for Co-Treatment: Complexity of the patient's impairments (multi-system  involvement);To address functional/ADL transfers;Necessary to address cognition/behavior during functional activity;For patient/therapist safety PT goals addressed during session: Mobility/safety with mobility;Balance;Proper use of DME OT goals addressed during session: ADL's and self-care      AM-PAC PT "6 Clicks" Mobility   Outcome Measure  Help needed turning from your back to your side while in a flat bed without using bedrails?: A Lot Help needed moving from lying on your back to sitting on the side of a flat bed without using bedrails?: A Lot Help needed moving to and from a bed to a chair (including a wheelchair)?: A Little Help needed standing up from a chair using your arms (e.g., wheelchair or bedside chair)?: A Little Help needed to walk in hospital room?: A Little Help needed climbing 3-5 steps with a railing? : A Lot 6 Click Score: 15    End of Session Equipment Utilized During Treatment: Gait belt Activity Tolerance: Patient limited by pain;Patient tolerated treatment well Patient left: in chair;with call bell/phone within reach;with family/visitor present Nurse Communication: Mobility status PT Visit Diagnosis: Pain;Difficulty in walking, not elsewhere classified (R26.2) Pain - part of body: (right flank, ribs)     Time: [id number] PT Time Calculation (min) (ACUTE ONLY): 31 min  Charges:  $Gait Training: 8-22 mins                     Wray Kearns, PT, DPT Acute Rehabilitation Services Pager [phone] Office [place] 09/30/2018, 9:56 AM

## 2018-09-30 NOTE — Discharge Instructions (Signed)
Gy x??ng s??n Rib Fracture  Gy x??ng s??n l gy ho?c n?t m?t trong s? cc x??ng s??n. X??ng s??n l cc x??ng di, cong bao quanh l?ng ng?c v g?n vo c?t s?ng v x??ng ?c c?a qu v?. X??ng s??n b?o v? tim, ph?i v cc c? quan khc trong ng?c. X??ng s??n b? gy ho?c n?t th??ng ?au ??n nh?ng khng nghim tr?ng. Theo th?i gian, h?u h?t cc ch? gy c?a x??ng s??n s? li?n. Tuy nhin, gy x??ng s??n c th? nghim tr?ng h?n n?u c nhi?u x??ng s??n b? gy ho?c n?u cc x??ng s??n b? gy tr?ch ra kh?i v? tr v ?m vo cc c?u trc ho?c c? quan khc. Nguyn nhn g gy ra? Tnh tr?ng ny l do:  Cc ??ng tc di chuy?n l?p ?i l?p l?i v?i c??ng ?? l?n, ch?ng h?n nh? nm bng chy ho?c c nh?ng c?n ho d? d?i.  C ?nh tr?c ti?p vo ng?c, ch?ng h?n nh? ch?n th??ng th? thao, tai n?n xe h?i ho?c ng.  Ung th? lan t?i x??ng, c th? lm suy y?u x??ng v khi?n x??ng b? gy. Cc d?u hi?u ho?c tri?u ch?ng l g? Nh?ng tri?u ch?ng c?a tnh tr?ng ny bao g?m:  ?au khi qu v? th? ho?c ho.  ?au khi ai ? ?n vo vng b? th??ng t?n.  C?m th?y kh th?. Ch?n ?on tnh tr?ng ny nh? th? no? Tnh tr?ng ny ???c ch?n ?on b?ng khai thc b?nh s? v khm th?c th?. Cc ki?m tra hnh ?nh c?ng c th? ???c th?c hi?n, ch?ng h?n nh?:  Ch?p X quang ng?c.  Ch?p CT.  Ch?p MRI.  Ch?p x??ng.  Siu m ng?c. Tnh tr?ng ny ???c ?i?u tr? nh? th? no? Vi?c ?i?u tr? ti?nh tra?ng ny ty thu?c vo m??c ?? n?ng c?a gy x??ng. H?u h?t cc ch? gy x??ng s??n th??ng s? t? li?n trong 1-3 thng. ?i khi qu trnh li?n s? lu h?n n?u c ho khng d?ng ho?c n?u c cc ho?t ??ng khc lm cho t?n th??ng tr?m tr?ng h?n (cc y?u t? lm b?nh n?ng thm). Trong khi li?n x??ng, qu v? s? ???c cho thu?c ki?m sot ?au. Qu v? c?ng s? ???c d?y cc bi t?p th? su. Cc t?n th??ng n?ng c th? c?n ph?i nh?p vi?n ho?c ph?u thu?t. Tun th? nh?ng h??ng d?n ny ? nh: X? tr ?au, c?ng kh?p v s?ng n?  N?u ???c ch? d?n, ch??m ? l?nh vo vng b? t?n  th??ng. ? Cho ? l?nh vo ti ni lng. ? ?? kh?n t?m ? gi?a da v ti ch??m. ? Ch??m ? l?nh trong 20 pht, 2-3 l?n m?i ngy.  Ch? s? d?ng thu?c khng k ??n v thu?c k ??n theo ch? d?n c?a chuyn gia ch?m [place] s?c kh?e. Ho?t ??ng  Trnh ho?t ??ng nhi?u v b?t k? ho?t ??ng ho?c v?n ??ng no gy ?au. C?n th?n trong cc ho?t ??ng v trnh va vo x??ng s??n b? th??ng t?n.  T?ng d?n ho?t ??ng theo ch? d?n c?a chuyn gia ch?m [place] s?c kh?e. H??ng d?n chung  T?p cc bi t?p th? su theo ch? d?n c?a chuyn gia ch?m [place] s?c kh?e. Vi?c ny s? gip ng?n ng?a b?nh vim ph?i, m?t bi?n ch?ng th??ng g?p c?a gy x??ng s??n. Chuyn gia ch?m [place] s?c kh?e c th? ch? d?n qu v? trnh: ? Ht th? su vi l?n m?i ngy. ? C? g?ng ho vi l?n m?i ngy trong khi p m?t ci g?i  vo vng b? th??ng t?n. ? Dng m?t d?ng c? c tn l ph? dung k? khuy?n khch ?? t?p ht th? su nhi?u l?n m?i ngy.  U?ng ?? n??c ?? gi? cho n??c ti?u c mu vng nh?t.  Khng ?eo ?ai ho?c b x??ng s??n. Nh?ng v?t ny lm h?n ch? h h?p, c th? d?n ??n vim ph?i.  Tun th? t?t c? cc l?n khm theo di theo ch? d?n c?a chuyn gia ch?m [place] s?c kh?e. ?i?u ny c vai tr quan tr?ng. Hy lin l?c v?i chuyn gia ch?m [place] s?c kh?e n?u:  Qu v? b? s?t. Yu c?u tr? gip ngay l?p t?c n?u:  Qu v? b? kh th? ho?c th? d?c.  Qu v? b? ho khng d?ng ho?c ho ra ??m ??c ho?c c mu.  Qu v? b? bu?n nn, nn ho?c ?au ? b?ng.  Qu v? ?au nhi?u h?n ho?c khng ?? sau khi s? d?ng thu?c. Tm t?t  Gy x??ng s??n l gy ho?c n?t m?t trong s? cc x??ng s??n.  X??ng s??n b? gy ho?c n?t th??ng ?au ??n nh?ng khng nghim tr?ng.  Theo th?i gian, h?u h?t cc ch? gy c?a x??ng s??n s? li?n.  Vi?c ?i?u tr? ti?nh tra?ng ny ty thu?c vo m??c ?? n?ng c?a gy x??ng.  Hessie Diener ho?t ??ng nhi?u v b?t k? ho?t ??ng ho?c v?n ??ng no gy ?au. Thng tin ny khng nh?m m?c ?ch thay th? cho l?i khuyn m chuyn gia ch?m [place] s?c kh?e ni v?i qu v?. Hy b?o ??m qu  v? ph?i th?o lu?n b?t k? v?n ?? g m qu v? c v?i chuyn gia ch?m [place] s?c kh?e c?a qu v?. Document Released: 01/02/2005 Document Revised: 09/14/2016 Document Reviewed: 09/14/2016 Elsevier Patient Education  2020 [employer].

## 2018-09-30 NOTE — Discharge Summary (Signed)
Physician Discharge Summary  Patient ID: Natalie Walls MRN: [id number] DOB/AGE: March 26, 1935 83 y.o.  Admit date: 09/27/2018 Discharge date: 09/30/2018  Discharge Diagnoses Fall  Right 7-9 rib fractures Incidental finding of gallbladder mass with MRI showing masses of liver and possible lymph node malignancy AKI  Consultants Medical Oncology  Procedures None   HPI: Patient is a 83 year old [place] speaking female with a history of OSA on CPAP, CKD, DDD, HTN, osteoporosis who presented emergency department today after a fall.  Husband at bedside. [employer] interpreter, Natalie Walls, used for interpretation. Patient tripped on a carpet in the bathroom and fell onto her right side onto the commode. She denied hitting her head or LOC. She complained of severe, constant, right-sided rib pain radiating into her back, worse with deep breaths and movement. After she fell she crawled out of the bathroom where her husband found her. Patient denied any abdominal pain prior to her fall. She reported her bowel movements have been normal.  She had no other complaints or associated symptoms at this time. Patient was admitted to the trauma service and transferred to [facility]. Patient was notified of incidental finding on CT scan concerning for gallbladder neoplasm.   Hospital Course: MRCP done of abdomen which showed possible liver metastases and abnormal lymph node. Case and imaging discussed with surgical oncologist who did not feel patient was a candidate for surgical resection. Medical oncology called to set up outpatient follow up. Patient noted to have slightly elevated creatinine 9/14 but taking in fluids well and with good urine output, toradol stopped and patient recommended to follow up with PCP in 1 week for lab check. Patient evaluated by PT/OT who recommended home health therapies. On 09/30/18 patient was tolerating diet, voiding appropriately, pain reasonably well controlled, and VSS. Patient discharged  home with her 2 sons in stable condition with follow up as outlined below.   I have personally looked this patient up in the Controlled Substance Database and reviewed their medications.   Allergies as of 09/30/2018   No Known Allergies     Medication List    TAKE these medications   amLODipine 5 MG tablet Commonly known as: NORVASC Take 5 mg by mouth daily.   atorvastatin 80 MG tablet Commonly known as: LIPITOR Take 40 mg by mouth daily at 6 PM.   benzonatate 100 MG capsule Commonly known as: TESSALON Take 100 mg by mouth 3 (three) times daily as needed for cough.   cetirizine 10 MG tablet Commonly known as: ZYRTEC Take 10 mg by mouth daily.   cholecalciferol 25 MCG (1000 UT) tablet Commonly known as: VITAMIN D3 Take 1,000 Units by mouth daily.   cyclobenzaprine 5 MG tablet Commonly known as: FLEXERIL Take 15 mg by mouth at bedtime.   docusate sodium 100 MG capsule Commonly known as: COLACE Take 1 capsule (100 mg total) by mouth 2 (two) times daily.   doxepin 10 MG capsule Commonly known as: SINEQUAN 1 at bedtime for leg cramps and sleep What changed:   how much to take  how to take this  when to take this  additional instructions   fluticasone 50 MCG/ACT nasal spray Commonly known as: FLONASE Place 1 spray into both nostrils daily as needed for allergies or rhinitis.   HYDROcodone-acetaminophen 5-325 MG tablet Commonly known as: NORCO/VICODIN Take 1-2 tablets by mouth every 4 (four) hours as needed for severe pain.   losartan 25 MG tablet Commonly known as: COZAAR Take 25 mg by mouth daily.  methocarbamol 500 MG tablet Commonly known as: ROBAXIN Take 1 tablet (500 mg total) by mouth 3 (three) times daily.   ProAir HFA 108 (90 Base) MCG/ACT inhaler Generic drug: albuterol Inhale 2 puffs into the lungs every 4 (four) hours as needed for wheezing or shortness of breath.   traMADol 50 MG tablet Commonly known as: ULTRAM Take 1 tablet (50 mg  total) by mouth every 6 (six) hours as needed for moderate pain.            Durable Medical Equipment  (From admission, onward)         Start     Ordered   09/30/18 1001  For home use only DME 3 n 1  Once     09/30/18 1000   09/30/18 1000  For home use only DME Walker rolling  Once    Question:  Patient needs a walker to treat with the following condition  Answer:  Multiple fractures of ribs, right side, initial encounter for closed fracture   09/30/18 1000           Follow-up Information    Natalie Hose, MD. Call.   Specialty: Internal Medicine Why: Call and schedule an appointment to be seen in 1 week for pain management related to rib fractures and follow up on kidney function.  Contact information: [place] [address] [place] Follow up.   Why: The cancer center should call you with an outpatient appointment.           Signed: Brigid Walls , [facility] 09/30/2018, 11:36 AM Pager: [phone]

## 2018-09-30 NOTE — TOC Transition Note (Signed)
Transition of Care [facility]) - CM/SW Discharge Note   Patient Details  Name: Natalie Walls MRN: [id number] Date of Birth: [birth year]  [facility] CM/SW Contact:  Ella Bodo, RN Phone Number: 09/30/2018, 1:48 PM   Clinical Narrative: 43 female presenting with right 7-9 rib fxs post fall. Incidental finding of gallbladder mass with MRI showing mutiple concerning masses of liver.  PTA, pt independent, lives at home with husband and adult children.  PT/OT recommending HH follow up, and pt/son agreeable to services.   Conversation had with use of [employer] Video intrerpreter.  Referral to [facility] for [facility] needs; referral to [place] for DME needs.  RW and 3 in 1 to be delivered to bedside prior to discharge home.        Final next level of care: Home w Home Health Services Barriers to Discharge: Barriers Resolved   Patient Goals and CMS Choice   CMS Medicare.gov Compare Post Acute Care list provided to:: Patient Represenative (must comment)(son) Choice offered to / list presented to : Adult Children                        Discharge Plan and Services   Discharge Planning Services: CM Consult Post Acute Care Choice: Home Health          DME Arranged: 3-N-1, Walker rolling DME Agency: [employer] Date DME Agency Contacted: 09/30/18 Time DME Agency Contacted: [id number] Representative spoke with at DME Agency: [place]: PT, OT [place] Agency: [place] ([place]) Date [place]: 09/30/18 Time [place]: 88 Representative spoke with at [place]: Blair Heys    Readmission Risk Interventions Readmission Risk Prevention Plan 09/30/2018  Transportation Screening Complete  PCP or Specialist Appt within 5-7 Days Complete  Home Care Screening Complete  Medication Review (RN CM) Complete   Reinaldo Raddle, RN, BSN  Trauma/Neuro ICU Case Manager [phone]

## 2018-10-02 DIAGNOSIS — E119 Type 2 diabetes mellitus without complications: Secondary | ICD-10-CM | POA: Diagnosis not present

## 2018-10-02 DIAGNOSIS — I10 Essential (primary) hypertension: Secondary | ICD-10-CM | POA: Diagnosis not present

## 2018-10-02 DIAGNOSIS — S2231XS Fracture of one rib, right side, sequela: Secondary | ICD-10-CM | POA: Diagnosis not present

## 2018-10-02 DIAGNOSIS — K828 Other specified diseases of gallbladder: Secondary | ICD-10-CM | POA: Diagnosis not present

## 2018-10-02 DIAGNOSIS — E559 Vitamin D deficiency, unspecified: Secondary | ICD-10-CM | POA: Diagnosis not present

## 2018-10-02 DIAGNOSIS — G2581 Restless legs syndrome: Secondary | ICD-10-CM | POA: Diagnosis not present

## 2018-10-07 ENCOUNTER — Inpatient Hospital Stay: Payer: Medicare Other | Attending: Oncology | Admitting: Oncology

## 2018-10-07 ENCOUNTER — Other Ambulatory Visit: Payer: Self-pay

## 2018-10-07 ENCOUNTER — Telehealth: Payer: Self-pay | Admitting: Oncology

## 2018-10-07 VITALS — BP 134/61 | HR 77 | Temp 98.0°F | Resp 17 | Ht 67.0 in | Wt 133.1 lb

## 2018-10-07 DIAGNOSIS — R160 Hepatomegaly, not elsewhere classified: Secondary | ICD-10-CM

## 2018-10-07 DIAGNOSIS — W19XXXA Unspecified fall, initial encounter: Secondary | ICD-10-CM | POA: Diagnosis not present

## 2018-10-07 DIAGNOSIS — K769 Liver disease, unspecified: Secondary | ICD-10-CM | POA: Diagnosis not present

## 2018-10-07 DIAGNOSIS — S2241XA Multiple fractures of ribs, right side, initial encounter for closed fracture: Secondary | ICD-10-CM

## 2018-10-07 DIAGNOSIS — K828 Other specified diseases of gallbladder: Secondary | ICD-10-CM

## 2018-10-07 DIAGNOSIS — R079 Chest pain, unspecified: Secondary | ICD-10-CM | POA: Diagnosis not present

## 2018-10-07 DIAGNOSIS — I313 Pericardial effusion (noninflammatory): Secondary | ICD-10-CM

## 2018-10-07 DIAGNOSIS — I509 Heart failure, unspecified: Secondary | ICD-10-CM | POA: Diagnosis not present

## 2018-10-07 NOTE — Progress Notes (Signed)
[facility] New Patient Consult   Requesting MD: Donzetta Kohut [address] [place],  [place] [address]   [place] [address] [place] 83 y.o.  1935/04/26    Reason for Consult: Abnormal appearing gallbladder, liver lesions   HPI: Ms. Schut is seen today with a [place] interpreter, her son, and the GI nurse navigator. She presented emergency room on 09/27/2018 for a fall in her bathroom.  She reported acute right-sided chest pain.  CTs of the chest, abdomen, and pelvis revealed acute fractures of the right seventh through ninth ribs.  No pneumothorax.  Bronchiectasis and collapse of the right middle lobe is felt to be secondary to chronic atypical infection. An annular lesion was noted in the gallbladder body wall thickening and hyperenhancement..  Masslike foci adjacent liver.  There is an enhancing portacaval node. An MRI of the abdomen on 09/28/2018 revealed asymmetric mural thickening and enhancement of the gallbladder wall concerning for a primary neoplasm.  Multiple malignant appearing lesions were noted in the adjacent central liver metastases.  Enlarged and enhancing hepato-duodenal ligament lymph node.  Cardiomegaly and a trace right pleural effusion. She was discharged home on 09/30/2018.  She continues to have right chest wall pain.  Past Medical History:  Diagnosis Date  . Allergic rhinitis   . Asthma   . CHF (congestive heart failure) ([facility])   . Chronic pain   . Hyperlipidemia   . Hypertension   . Osteoporosis   . Pericardial effusion 05/20/2018  . Sleep apnea     .  G5 P4, 1 miscarriage  Past Surgical History:  Procedure Laterality Date  . BACK SURGERY    . CARDIAC SURGERY  2014   Unknown type  . PERICARDIAL WINDOW  07/18/2011    .  Left shoulder surgery  Medications: Reviewed  Allergies: No Known Allergies  Family history: No family history of cancer  Social History:   She lives with her 2 sons, a daughter-in-law, and her husband in  [place].  She worked as a Regulatory affairs officer.  She does not use cigarettes or alcohol.  No risk factors for hepatitis.  ROS:   Positives include: Pain at the right posterior lateral chest, worse with movement  A complete ROS was otherwise negative.  Physical Exam:  Blood pressure 134/61, pulse 77, temperature 98 F (36.7 C), temperature source Temporal, resp. rate 17, height 5\' 7"  (1.702 m), weight 133 lb 1.6 oz (60.4 kg), SpO2 100 %.  HEENT: Neck without mass Lungs: Inspiratory rhonchi at the right greater than left lower posterior chest, no respiratory distress Cardiac: Regular rate and rhythm Abdomen: Nontender, no hepatosplenomegaly, no mass  Vascular: No leg edema Lymph nodes: No cervical, supraclavicular, axillary, or inguinal nodes Neurologic: Alert and oriented, follows commands, motor exam appears intact in the upper and lower extremities bilaterally Skin: Ecchymosis at the iliac area Musculoskeletal: Tender at the right posterior lateral chest wall   LAB:  CBC  Lab Results  Component Value Date   WBC 10.4 09/30/2018   HGB 11.8 (L) 09/30/2018   HCT 37.1 09/30/2018   MCV 96.9 09/30/2018   PLT 235 09/30/2018   NEUTROABS 9.8 (H) 09/27/2018        CMP  Lab Results  Component Value Date   NA 135 09/30/2018   K 4.4 09/30/2018   CL 100 09/30/2018   CO2 27 09/30/2018   GLUCOSE 119 (H) 09/30/2018   BUN 36 (H) 09/30/2018   CREATININE 1.06 (H) 09/30/2018   CALCIUM 8.5 (  L) 09/30/2018   PROT 6.3 (L) 09/30/2018   ALBUMIN 2.7 (L) 09/30/2018   AST 29 09/30/2018   ALT 25 09/30/2018   ALKPHOS 74 09/30/2018   BILITOT 0.9 09/30/2018   GFRNONAA 49 (L) 09/30/2018   GFRAA 57 (L) 09/30/2018     No results found for: CEA1  Imaging:  CT 09/27/2018 and MRI 09/28/2018-images reviewed with Ms. Norwood and her son   Assessment/Plan:   1. Gallbladder wall thickening/enhancement, liver lesions  CT abdomen/pelvis 09/27/2018- annular lesion in the gallbladder body with wall  thickening and hyperenhancement, similar appearing masslike foci in the adjacent liver, enhancing portacaval node  MRI abdomen 09/28/2018-asymmetric mural thickening and enhancement of the gallbladder wall, multiple malignant appearing liver lesions in the [facility] central liver, enlarged hepatoduodenal ligament node  2. Fall 09/27/2018 with multiple right-sided rib fractures 3. Pain secondary to #2 4.   CHF 5.   History of a pericardial effusion   Disposition:    Ms. Bierl was discovered to have gallbladder wall thickening/enhancement and multiple liver lesions when she presented to the emergency room after a fall.  She developed acute right-sided rib fractures.  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  I discussed the probable diagnosis and treatment options with Ms. Zachman and her family.  We reviewed the CT and MRI images.  She is not a surgical candidate.  She understands no therapy will be curative.  We discussed comfort care versus a trial of systemic chemotherapy.  We discussed the indication for a diagnostic biopsy.  She would like to proceed with a biopsy to establish a diagnosis.  She will return after the biopsy to formulate a treatment plan.  Ms. Penaloza will continue tramadol and hydrocodone as needed for pain.  Betsy Coder, MD  10/07/2018, 5:45 PM

## 2018-10-07 NOTE — Telephone Encounter (Signed)
Scheduled appt per 9/21 los. ° °Printed calendar and avs. °

## 2018-10-07 NOTE — Progress Notes (Signed)
Met with Red Christians and son with interpreter present . Explained role of nurse navigator. Patient has two adult sons. Gus Puma will be the main contact for patient @ [phone]. Contact names and phone numbers were provided for entire [facility] team.  Reviewed natural remedies for constipation; drink more water, eat more fiber, increase exercise if able, take Colace and MiraLAX once daily may increase colace to AM and PM if needed. Will support and follow as needed.

## 2018-10-17 ENCOUNTER — Other Ambulatory Visit: Payer: Self-pay | Admitting: Radiology

## 2018-10-18 ENCOUNTER — Other Ambulatory Visit: Payer: Self-pay

## 2018-10-18 ENCOUNTER — Ambulatory Visit (HOSPITAL_COMMUNITY)
Admission: RE | Admit: 2018-10-18 | Discharge: 2018-10-18 | Disposition: A | Discharge status: Home / Self Care | Payer: Medicare Other | Source: Ambulatory Visit | Attending: Oncology | Admitting: Oncology

## 2018-10-18 DIAGNOSIS — E785 Hyperlipidemia, unspecified: Secondary | ICD-10-CM | POA: Diagnosis not present

## 2018-10-18 DIAGNOSIS — Z79899 Other long term (current) drug therapy: Secondary | ICD-10-CM | POA: Insufficient documentation

## 2018-10-18 DIAGNOSIS — I130 Hypertensive heart and chronic kidney disease with heart failure and stage 1 through stage 4 chronic kidney disease, or unspecified chronic kidney disease: Secondary | ICD-10-CM | POA: Insufficient documentation

## 2018-10-18 DIAGNOSIS — C227 Other specified carcinomas of liver: Secondary | ICD-10-CM | POA: Insufficient documentation

## 2018-10-18 DIAGNOSIS — N189 Chronic kidney disease, unspecified: Secondary | ICD-10-CM | POA: Insufficient documentation

## 2018-10-18 DIAGNOSIS — K7689 Other specified diseases of liver: Secondary | ICD-10-CM | POA: Diagnosis not present

## 2018-10-18 DIAGNOSIS — Z87891 Personal history of nicotine dependence: Secondary | ICD-10-CM | POA: Insufficient documentation

## 2018-10-18 DIAGNOSIS — I509 Heart failure, unspecified: Secondary | ICD-10-CM | POA: Insufficient documentation

## 2018-10-18 DIAGNOSIS — R160 Hepatomegaly, not elsewhere classified: Secondary | ICD-10-CM | POA: Diagnosis present

## 2018-10-18 DIAGNOSIS — G4733 Obstructive sleep apnea (adult) (pediatric): Secondary | ICD-10-CM | POA: Diagnosis not present

## 2018-10-18 DIAGNOSIS — C229 Malignant neoplasm of liver, not specified as primary or secondary: Secondary | ICD-10-CM | POA: Diagnosis not present

## 2018-10-18 LAB — COMPREHENSIVE METABOLIC PANEL
ALT: 31 U/L (ref 0–44)
AST: 39 U/L (ref 15–41)
Albumin: 3.4 g/dL — ABNORMAL LOW (ref 3.5–5.0)
Alkaline Phosphatase: 148 U/L — ABNORMAL HIGH (ref 38–126)
Anion gap: 12 (ref 5–15)
BUN: 19 mg/dL (ref 8–23)
CO2: 22 mmol/L (ref 22–32)
Calcium: 8.9 mg/dL (ref 8.9–10.3)
Chloride: 106 mmol/L (ref 98–111)
Creatinine, Ser: 0.7 mg/dL (ref 0.44–1.00)
GFR calc Af Amer: >60 mL/min (ref >60)
GFR calc non Af Amer: >60 mL/min (ref >60)
Glucose, Bld: 97 mg/dL (ref 70–99)
Potassium: 4.6 mmol/L (ref 3.5–5.1)
Sodium: 140 mmol/L (ref 135–145)
Total Bilirubin: 0.5 mg/dL (ref 0.3–1.2)
Total Protein: 7.5 g/dL (ref 6.5–8.1)

## 2018-10-18 LAB — CBC WITH DIFFERENTIAL/PLATELET
Abs Immature Granulocytes: 0.03 10*3/uL (ref 0.00–0.07)
Basophils Absolute: 0.1 10*3/uL (ref 0.0–0.1)
Basophils Relative: 1 %
Eosinophils Absolute: 0.5 10*3/uL (ref 0.0–0.5)
Eosinophils Relative: 5 %
HCT: 36.8 % (ref 36.0–46.0)
Hemoglobin: 11.4 g/dL — ABNORMAL LOW (ref 12.0–15.0)
Immature Granulocytes: 0 %
Lymphocytes Relative: 24 %
Lymphs Abs: 2.3 10*3/uL (ref 0.7–4.0)
MCH: 30.7 pg (ref 26.0–34.0)
MCHC: 31 g/dL (ref 30.0–36.0)
MCV: 99.2 fL (ref 80.0–100.0)
Monocytes Absolute: 1.1 10*3/uL — ABNORMAL HIGH (ref 0.1–1.0)
Monocytes Relative: 12 %
Neutro Abs: 5.7 10*3/uL (ref 1.7–7.7)
Neutrophils Relative %: 58 %
Platelets: 367 10*3/uL (ref 150–400)
RBC: 3.71 MIL/uL — ABNORMAL LOW (ref 3.87–5.11)
RDW: 13.4 % (ref 11.5–15.5)
WBC: 9.7 10*3/uL (ref 4.0–10.5)
nRBC: 0 % (ref 0.0–0.2)

## 2018-10-18 LAB — PROTIME-INR
INR: 0.9 (ref 0.8–1.2)
Prothrombin Time: 11.7 seconds (ref 11.4–15.2)

## 2018-10-18 MED ORDER — FENTANYL CITRATE (PF) 100 MCG/2ML IJ SOLN
INTRAMUSCULAR | Status: AC
  Filled 2018-10-18: qty 2

## 2018-10-18 MED ORDER — LIDOCAINE HCL 1 % IJ SOLN
INTRAMUSCULAR | Status: AC
  Filled 2018-10-18: qty 20

## 2018-10-18 MED ORDER — FENTANYL CITRATE (PF) 100 MCG/2ML IJ SOLN
INTRAMUSCULAR | Status: AC | PRN
  Administered 2018-10-18 (×2): 50 ug via INTRAVENOUS

## 2018-10-18 MED ORDER — MIDAZOLAM HCL 2 MG/2ML IJ SOLN
INTRAMUSCULAR | Status: AC
  Filled 2018-10-18: qty 2

## 2018-10-18 MED ORDER — SODIUM CHLORIDE 0.9 % IV SOLN
INTRAVENOUS | Status: DC
  Administered 2018-10-18: 12:00:00 via INTRAVENOUS

## 2018-10-18 MED ORDER — HYDROCODONE-ACETAMINOPHEN 5-325 MG PO TABS: 1.0000 | ORAL_TABLET | ORAL | Status: DC | PRN

## 2018-10-18 MED ORDER — MIDAZOLAM HCL 2 MG/2ML IJ SOLN
INTRAMUSCULAR | Status: AC | PRN
  Administered 2018-10-18: 1 mg via INTRAVENOUS

## 2018-10-18 NOTE — Sedation Documentation (Signed)
Dr. Yamagata made aware of pt's elevated BP. 

## 2018-10-18 NOTE — Sedation Documentation (Signed)
Pt states that she did not take her BP medication today.  The last time she took her BP medication was last night.

## 2018-10-18 NOTE — Discharge Instructions (Signed)
Moderate Conscious Sedation, Adult, Care After These instructions provide you with information about caring for yourself after your procedure. Your health care provider may also give you more specific instructions. Your treatment has been planned according to current medical practices, but problems sometimes occur. Call your health care provider if you have any problems or questions after your procedure. What can I expect after the procedure? After your procedure, it is common:  To feel sleepy for several hours.  To feel clumsy and have poor balance for several hours.  To have poor judgment for several hours.  To vomit if you eat too soon. Follow these instructions at home: For at least 24 hours after the procedure:   Do not: ? Participate in activities where you could fall or become injured. ? Drive. ? Use heavy machinery. ? Drink alcohol. ? Take sleeping pills or medicines that cause drowsiness. ? Make important decisions or sign legal documents. ? Take care of children on your own.  Rest. Eating and drinking  Follow the diet recommended by your health care provider.  If you vomit: ? Drink water, juice, or soup when you can drink without vomiting. ? Make sure you have little or no nausea before eating solid foods. General instructions  Have a responsible adult stay with you until you are awake and alert.  Take over-the-counter and prescription medicines only as told by your health care provider.  If you smoke, do not smoke without supervision.  Keep all follow-up visits as told by your health care provider. This is important. Contact a health care provider if:  You keep feeling nauseous or you keep vomiting.  You feel light-headed.  You develop a rash.  You have a fever. Get help right away if:  You have trouble breathing. This information is not intended to replace advice given to you by your health care provider. Make sure you discuss any questions you have  with your health care provider. Document Released: 10/23/2012 Document Revised: 12/15/2016 Document Reviewed: 04/24/2015 Elsevier Patient Education  2020 [employer].   Liver Biopsy, Care After These instructions give you information on caring for yourself after your procedure. Your doctor may also give you more specific instructions. Call your doctor if you have any problems or questions after your procedure. What can I expect after the procedure? After the procedure, it is common to have:  Pain and soreness where the biopsy was done.  Bruising around the area where the biopsy was done.  Sleepiness and be tired for a few days. Follow these instructions at home: Medicines  Take over-the-counter and prescription medicines only as told by your doctor.  If you were prescribed an antibiotic medicine, take it as told by your doctor. Do not stop taking the antibiotic even if you start to feel better.  Do not take medicines such as aspirin and ibuprofen. These medicines can thin your blood. Do not take these medicines unless your doctor tells you to take them.  If you are taking prescription pain medicine, take actions to prevent or treat constipation. Your doctor may recommend that you: ? Drink enough fluid to keep your pee (urine) clear or pale yellow. ? Take over-the-counter or prescription medicines. ? Eat foods that are high in fiber, such as fresh fruits and vegetables, whole grains, and beans. ? Limit foods that are high in fat and processed sugars, such as fried and sweet foods. Caring for your cut  Follow instructions from your doctor about how to take care of  from surgery (incisions). Make sure you: °? Wash your hands with soap and water before you change your bandage (dressing). If you cannot use soap and water, use hand sanitizer. °? Change your bandage as told by your doctor. °? Leave stitches (sutures), skin glue, or skin tape (adhesive) strips in place. They may need to  stay in place for 2 weeks or longer. If tape strips get loose and curl up, you may trim the loose edges. Do not remove tape strips completely unless your doctor says it is okay. °· Check your cuts every day for signs of infection. Check for: °? Redness, swelling, or more pain. °? Fluid or blood. °? Pus or a bad smell. °? Warmth. °· Do not take baths, swim, or use a hot tub until your doctor says it is okay to do so. °Activity ° °· Rest at home for 1-2 days or as told by your doctor. °? Avoid sitting for a long time without moving. Get up to take short walks every 1-2 hours. °· Return to your normal activities as told by your doctor. Ask what activities are safe for you. °· Do not do these things in the first 24 hours: °? Drive. °? Use machinery. °? Take a bath or shower. °· Do not lift more than 10 pounds (4.5 kg) or play contact sports for the first 2 weeks. °General instructions ° °· Do not drink alcohol in the first week after the procedure. °· Have someone stay with you for at least 24 hours after the procedure. °· Get your test results. Ask your doctor or the department that is doing the test: °? When will my results be ready? °? How will I get my results? °? What are my treatment options? °? What other tests do I need? °? What are my next steps? °· Keep all follow-up visits as told by your doctor. This is important. °Contact a doctor if: °· A cut bleeds and leaves more than just a small spot of blood. °· A cut is red, puffs up (swells), or hurts more than before. °· Fluid or something else comes from a cut. °· A cut smells bad. °· You have a fever or chills. °Get help right away if: °· You have swelling, bloating, or pain in your belly (abdomen). °· You get dizzy or faint. °· You have a rash. °· You feel sick to your stomach (nauseous) or throw up (vomit). °· You have trouble breathing, feel short of breath, or feel faint. °· Your chest hurts. °· You have problems talking or seeing. °· You have trouble with  your balance or moving your arms or legs. °Summary °· After the procedure, it is common to have pain, soreness, bruising, and tiredness. °· Your doctor will tell you how to take care of yourself at home. Change your bandage, take your medicines, and limit your activities as told by your doctor. °· Call your doctor if you have symptoms of infection. Get help right away if your belly swells, your cut bleeds a lot, or you have trouble talking or breathing. °This information is not intended to replace advice given to you by your health care provider. Make sure you discuss any questions you have with your health care provider. °Document Released: 10/12/2007 Document Revised: 01/12/2017 Document Reviewed: 01/12/2017 °Elsevier Patient Education © 2020 Elsevier Inc. ° °

## 2018-10-18 NOTE — Procedures (Signed)
Interventional Radiology Procedure Note ° °Procedure: US Guided Biopsy of liver mass ° °Complications: None ° °Estimated Blood Loss: < 10 mL ° °Findings: °18 G core biopsy of liver mass performed under US guidance.  Three core samples obtained and sent to Pathology. ° °Chantee Cerino T. Cristopher Ciccarelli, M.D °Pager:  [phone] ° °  °

## 2018-10-18 NOTE — Consult Note (Signed)
Chief Complaint: Patient was seen in consultation today for image guided liver lesion biopsy  Referring Physician(s): Sherrill,Gary B  Supervising Physician: Aletta Edouard  Patient Status: [facility] - Out-pt  History of Present Illness: Natalie Walls is an 83 y.o. female, [place] speaking, with history of OSA on CPAP, chronic kidney disease, degenerative disc disease, hypertension, osteoporosis who recently had a fall at home with subsequent imaging revealing acute fractures of the right seventh through ninth ribs, bronchiectasis and collapsed right middle lobe lung, annular lesion in the gallbladder body with wall thickening and hyperenhancement, multiple liver lesions concerning for malignancy as well as enlarged and enhancing hepatoduodenal ligament lymph node.  Patient has no prior history of malignancy. She presents today for image guided liver lesion biopsy for further evaluation.  Past Medical History:  Diagnosis Date   Allergic rhinitis    Asthma    CHF (congestive heart failure) (HCC)    Chronic pain    Hyperlipidemia    Hypertension    Osteoporosis    Pericardial effusion 05/20/2018   Sleep apnea     Past Surgical History:  Procedure Laterality Date   BACK SURGERY     CARDIAC SURGERY  2014   Unknown type   PERICARDIAL WINDOW  07/18/2011    Allergies: Patient has no known allergies.  Medications: Prior to Admission medications   Medication Sig Start Date End Date Taking? Authorizing Provider  albuterol (PROAIR HFA) 108 (90 Base) MCG/ACT inhaler Inhale 2 puffs into the lungs every 4 (four) hours as needed for wheezing or shortness of breath.  05/14/14  Yes [provider]  amLODipine (NORVASC) 5 MG tablet Take 5 mg by mouth daily.   Yes [provider]  atorvastatin (LIPITOR) 80 MG tablet Take 40 mg by mouth daily at 6 PM.  06/19/17  Yes [provider]  benzonatate (TESSALON) 100 MG capsule Take 100 mg by mouth 3 (three)  times daily as needed for cough.   Yes [provider]  cholecalciferol (VITAMIN D3) 25 MCG (1000 UT) tablet Take 1,000 Units by mouth daily.   Yes [provider]  cyclobenzaprine (FLEXERIL) 5 MG tablet Take 15 mg by mouth at bedtime.    Yes [provider]  docusate sodium (COLACE) 100 MG capsule Take 1 capsule (100 mg total) by mouth 2 (two) times daily. 09/30/18  Yes Rayburn, Floyce Stakes, PA-C  doxepin (SINEQUAN) 10 MG capsule 1 at bedtime for leg cramps and sleep Patient taking differently: Take 10 mg by mouth at bedtime.  06/24/18  Yes Young, Tarri Fuller D, MD  fluticasone (FLONASE) 50 MCG/ACT nasal spray Place 1 spray into both nostrils daily as needed for allergies or rhinitis.  05/14/14  Yes [provider]  HYDROcodone-acetaminophen (NORCO/VICODIN) 5-325 MG tablet Take 1-2 tablets by mouth every 4 (four) hours as needed for severe pain. 09/30/18  Yes Rayburn, Claiborne Billings A, PA-C  losartan (COZAAR) 25 MG tablet Take 25 mg by mouth daily.    Yes [provider]  methocarbamol (ROBAXIN) 500 MG tablet Take 1 tablet (500 mg total) by mouth 3 (three) times daily. 09/30/18  Yes Rayburn, Floyce Stakes, PA-C  traMADol (ULTRAM) 50 MG tablet Take 1 tablet (50 mg total) by mouth every 6 (six) hours as needed for moderate pain. 09/30/18  Yes Rayburn, Floyce Stakes, PA-C     No family history on file.  Social History   Socioeconomic History   Marital status: Married    Spouse name: Not on file  Number of children: Not on file   Years of education: Not on file   Highest education level: Not on file  Occupational History   Not on file  Social Needs   Financial resource strain: Not on file   Food insecurity    Worry: Not on file    Inability: Not on file   Transportation needs    Medical: Not on file    Non-medical: Not on file  Tobacco Use   Smoking status: Former Smoker    Types: Cigarettes    Quit date: 10/02/1989    Years since quitting: 29.0   Smokeless  tobacco: Never Used  Substance and Sexual Activity   Alcohol use: Not Currently   Drug use: Not Currently   Sexual activity: Not Currently  Lifestyle   Physical activity    Days per week: Not on file    Minutes per session: Not on file   Stress: Not on file  Relationships   Social connections    Talks on phone: Not on file    Gets together: Not on file    Attends religious service: Not on file    Active member of club or organization: Not on file    Attends meetings of clubs or organizations: Not on file    Relationship status: Not on file  Other Topics Concern   Not on file  Social History Narrative   Not on file     Review of Systems currently denies fever, headache, chest pain, dyspnea, cough, abdominal pain, nausea, vomiting or bleeding.  She does have back pain.  Vital Signs: BP (!) 149/97    Pulse 80    Temp 98.2 F (36.8 C) (Oral)    Resp 18    SpO2 100%   Physical Exam awake, alert; chest with few scattered inspiratory wheezes.  Heart with regular rate and rhythm.  Abdomen soft, positive bowel sounds, nontender.  No lower extremity edema.  Imaging: Dg Shoulder Right  Result Date: 09/27/2018 CLINICAL DATA:  Acute RIGHT shoulder pain following fall. Initial encounter. EXAM: RIGHT SHOULDER - 2+ VIEW COMPARISON:  None. FINDINGS: There is no evidence of fracture or dislocation. There is no evidence of arthropathy or other focal bone abnormality. Soft tissues are unremarkable. IMPRESSION: Negative. Electronically Signed   By: Margarette Canada M.D.   On: 09/27/2018 15:38   Ct Chest W Contrast  Result Date: 09/27/2018 CLINICAL DATA:  Right-sided pain after fall. EXAM: CT CHEST, ABDOMEN, AND PELVIS WITH CONTRAST TECHNIQUE: Multidetector CT imaging of the chest, abdomen and pelvis was performed following the standard protocol during bolus administration of intravenous contrast. CONTRAST:  180mL OMNIPAQUE IOHEXOL 300 MG/ML  SOLN COMPARISON:  Chest x-ray dated January 14, 2018. FINDINGS: CT CHEST FINDINGS Cardiovascular: Mild right atrial enlargement. Prominent pericardial fat. No pericardial effusion. No thoracic aortic aneurysm or dissection. Coronary, aortic arch, and branch vessel atherosclerotic vascular disease. No central pulmonary embolism. Mediastinum/Nodes: No enlarged mediastinal, hilar, or axillary lymph nodes. 8 mm hypodense nodule in the left thyroid lobe. The trachea and esophagus demonstrate no significant findings. Lungs/Pleura: Bilateral lower lobe subsegmental atelectasis. Trace right pleural effusion. No pneumothorax. No consolidation. 4 mm subpleural nodule in the anterior right upper lobe (series 6, image 71). Bronchiectasis and collapse of the right middle lobe, likely sequelae of chronic atypical infection. Musculoskeletal: Acute fractures of the right posterior and lateral seventh rib, posterior eighth rib, and posterior and lateral ninth rib. The posterior ninth rib fracture is moderately displaced. CT ABDOMEN  PELVIS FINDINGS Hepatobiliary: Hepatic steatosis. Annular lesion in the gallbladder body with narrowing, wall thickening, and wall hyperenhancement. Similar appearing infiltrative mass-like foci in the adjacent liver. No biliary dilatation. Pancreas: Mild atrophy. No ductal dilatation or surrounding inflammatory changes. Spleen: No splenic injury or perisplenic hematoma. Adrenals/Urinary Tract: No adrenal hemorrhage or renal injury identified. Bladder is unremarkable. Stomach/Bowel: Stomach is within normal limits. Appendix appears normal. No evidence of bowel wall thickening, distention, or inflammatory changes. Vascular/Lymphatic: Enlarged heterogeneously enhancing portacaval lymph node measuring 1.5 cm in short axis. Aortic atherosclerosis. Reproductive: Uterus and bilateral adnexa are unremarkable. Other: None. Musculoskeletal: No acute or significant osseous findings. Severe right hip osteoarthritis. 8.2 cm lipoma in the right tensor fascia lata.  IMPRESSION: Chest: 1. Acute fractures of the right seventh through ninth ribs. The seventh and ninth ribs are fractured in two places. 2. Trace right pleural effusion.  No pneumothorax. 3. Bronchiectasis and collapse of the right middle lobe, likely sequelae of chronic atypical infection. 4.  Aortic atherosclerosis (ICD10-I70.0). Abdomen and pelvis: 1. Annular lesion in the gallbladder body with narrowing, wall thickening, and wall hyperenhancement. Similar appearing infiltrative mass-like foci in the adjacent liver. 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 2. Enlarged heterogeneously enhancing portacaval lymph node, concerning for nodal metastasis. Electronically Signed   By: Titus Dubin M.D.   On: 09/27/2018 13:33   Ct Abdomen Pelvis W Contrast  Result Date: 09/27/2018 CLINICAL DATA:  Right-sided pain after fall. EXAM: CT CHEST, ABDOMEN, AND PELVIS WITH CONTRAST TECHNIQUE: Multidetector CT imaging of the chest, abdomen and pelvis was performed following the standard protocol during bolus administration of intravenous contrast. CONTRAST:  116mL OMNIPAQUE IOHEXOL 300 MG/ML  SOLN COMPARISON:  Chest x-ray dated January 14, 2018. FINDINGS: CT CHEST FINDINGS Cardiovascular: Mild right atrial enlargement. Prominent pericardial fat. No pericardial effusion. No thoracic aortic aneurysm or dissection. Coronary, aortic arch, and branch vessel atherosclerotic vascular disease. No central pulmonary embolism. Mediastinum/Nodes: No enlarged mediastinal, hilar, or axillary lymph nodes. 8 mm hypodense nodule in the left thyroid lobe. The trachea and esophagus demonstrate no significant findings. Lungs/Pleura: Bilateral lower lobe subsegmental atelectasis. Trace right pleural effusion. No pneumothorax. No consolidation. 4 mm subpleural nodule in the anterior right upper lobe (series 6, image 71). Bronchiectasis and collapse of the right middle lobe, likely sequelae of chronic  atypical infection. Musculoskeletal: Acute fractures of the right posterior and lateral seventh rib, posterior eighth rib, and posterior and lateral ninth rib. The posterior ninth rib fracture is moderately displaced. CT ABDOMEN PELVIS FINDINGS Hepatobiliary: Hepatic steatosis. Annular lesion in the gallbladder body with narrowing, wall thickening, and wall hyperenhancement. Similar appearing infiltrative mass-like foci in the adjacent liver. No biliary dilatation. Pancreas: Mild atrophy. No ductal dilatation or surrounding inflammatory changes. Spleen: No splenic injury or perisplenic hematoma. Adrenals/Urinary Tract: No adrenal hemorrhage or renal injury identified. Bladder is unremarkable. Stomach/Bowel: Stomach is within normal limits. Appendix appears normal. No evidence of bowel wall thickening, distention, or inflammatory changes. Vascular/Lymphatic: Enlarged heterogeneously enhancing portacaval lymph node measuring 1.5 cm in short axis. Aortic atherosclerosis. Reproductive: Uterus and bilateral adnexa are unremarkable. Other: None. Musculoskeletal: No acute or significant osseous findings. Severe right hip osteoarthritis. 8.2 cm lipoma in the right tensor fascia lata. IMPRESSION: Chest: 1. Acute fractures of the right seventh through ninth ribs. The seventh and ninth ribs are fractured in two places. 2. Trace right pleural effusion.  No pneumothorax. 3. Bronchiectasis and collapse of the right middle lobe, likely sequelae of chronic atypical infection.  4.  Aortic atherosclerosis (ICD10-I70.0). Abdomen and pelvis: 1. Annular lesion in the gallbladder body with narrowing, wall thickening, and wall hyperenhancement. Similar appearing infiltrative mass-like foci in the adjacent liver. 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 2. Enlarged heterogeneously enhancing portacaval lymph node, concerning for nodal metastasis. Electronically Signed   By: Titus Dubin M.D.   On:  09/27/2018 13:33   Mr 3d Recon At Scanner  Result Date: 09/28/2018 CLINICAL DATA:  83 year old female with history of right-sided abdominal pain after a fall. Abnormal CT demonstrating potential gallbladder mass. Follow-up study. EXAM: MRI ABDOMEN WITHOUT AND WITH CONTRAST (INCLUDING MRCP) TECHNIQUE: Multiplanar multisequence MR imaging of the abdomen was performed both before and after the administration of intravenous contrast. Heavily T2-weighted images of the biliary and pancreatic ducts were obtained, and three-dimensional MRCP images were rendered by post processing. CONTRAST:  41mL GADAVIST GADOBUTROL 1 MMOL/ML IV SOLN COMPARISON:  No prior abdominal MRI. CT the abdomen and pelvis 09/27/2018. FINDINGS: Comment: Portions of today's examination are limited by considerable patient respiratory motion. Lower chest: Cardiomegaly. Trace right pleural effusion lying dependently. Hepatobiliary: Diffuse loss of signal intensity throughout the hepatic parenchyma on out of phase dual echo images, indicative of hepatic steatosis. Within the central aspect of the liver predominantly in segments 4A/B, segment 5 and segment 8 there are multiple T1 I so to slightly hypointense, T2 hyperintense, centrally hypovascular peripherally enhancing lesions which demonstrate diffusion restriction. The largest of these measures up to 2.2 cm (axial image 18 of series 8). No intra or extrahepatic biliary ductal dilatation noted on MRCP images. Common bile duct measures 7 mm in the porta hepatis (within normal limits for the patient's age). No filling defects within the common bile duct to suggest choledocholithiasis. However, there is extensive mural thickening in the proximal to mid gallbladder which demonstrates enhancement on post gadolinium images, concerning for gallbladder neoplasm. No filling defects in the gallbladder to suggest the presence of cholelithiasis. Pancreas: No pancreatic mass. No pancreatic ductal dilatation noted  on MRCP images. No pancreatic or peripancreatic fluid collections or inflammatory changes. Spleen:  Unremarkable. Adrenals/Urinary Tract: Subcentimeter T1 hypointense, T2 hyperintense, nonenhancing lesion in the lower pole of the right kidney, compatible with a tiny simple cyst. Left kidney and bilateral adrenal glands are normal in appearance. No hydroureteronephrosis in the visualized portions of the abdomen. Stomach/Bowel: Visualized portions are unremarkable. Vascular/Lymphatic: Aortic atherosclerosis, without evidence of aneurysm in the abdominal vasculature. Mildly enlarged 1.2 cm short axis hepatic duodenal ligament lymph node which demonstrates diffusion restriction (axial image 83 of series 9) and heterogeneous enhancement, concerning for metastatic lymph node. Other: No significant volume of ascites noted in the visualized portions of the peritoneal cavity. Musculoskeletal: No aggressive appearing osseous lesions are noted in the visualized portions of the skeleton. IMPRESSION: 1. Asymmetric mural thickening and enhancement in the gallbladder wall highly concerning for primary gallbladder neoplasm. Multiple malignant appearing lesions noted in the adjacent central aspect of the liver, highly concerning for multifocal hepatic metastasis. In addition, there is an enlarged enhancing hepatic duodenal ligament lymph node which demonstrates diffusion restriction, which likely represents a local nodal metastasis. 2. Cardiomegaly. 3. Trace right pleural effusion. Electronically Signed   By: Vinnie Langton M.D.   On: 09/28/2018 04:24   Dg Chest Port 1 View  Result Date: 09/28/2018 CLINICAL DATA:  Assess cardiopulmonary status. Back pain, rib fracture. Recent fall. EXAM: PORTABLE CHEST 1 VIEW COMPARISON:  01/14/2018 and chest CT 09/27/2018 FINDINGS: Lungs are adequately inflated  demonstrate new small to moderate left effusion likely with associated basilar atelectasis. Subtle linear atelectasis right base.  Mild cardiomegaly. Known acute right posterior sixth and seventh rib fractures. IMPRESSION: New small to moderate left pleural effusion likely with associated basilar atelectasis. Stable cardiomegaly. Known acute right sixth and seventh rib fractures. Electronically Signed   By: Marin Olp M.D.   On: 09/28/2018 10:15   Mr Abdomen Mrcp Moise Boring Contast  Result Date: 09/28/2018 CLINICAL DATA:  83 year old female with history of right-sided abdominal pain after a fall. Abnormal CT demonstrating potential gallbladder mass. Follow-up study. EXAM: MRI ABDOMEN WITHOUT AND WITH CONTRAST (INCLUDING MRCP) TECHNIQUE: Multiplanar multisequence MR imaging of the abdomen was performed both before and after the administration of intravenous contrast. Heavily T2-weighted images of the biliary and pancreatic ducts were obtained, and three-dimensional MRCP images were rendered by post processing. CONTRAST:  73mL GADAVIST GADOBUTROL 1 MMOL/ML IV SOLN COMPARISON:  No prior abdominal MRI. CT the abdomen and pelvis 09/27/2018. FINDINGS: Comment: Portions of today's examination are limited by considerable patient respiratory motion. Lower chest: Cardiomegaly. Trace right pleural effusion lying dependently. Hepatobiliary: Diffuse loss of signal intensity throughout the hepatic parenchyma on out of phase dual echo images, indicative of hepatic steatosis. Within the central aspect of the liver predominantly in segments 4A/B, segment 5 and segment 8 there are multiple T1 I so to slightly hypointense, T2 hyperintense, centrally hypovascular peripherally enhancing lesions which demonstrate diffusion restriction. The largest of these measures up to 2.2 cm (axial image 18 of series 8). No intra or extrahepatic biliary ductal dilatation noted on MRCP images. Common bile duct measures 7 mm in the porta hepatis (within normal limits for the patient's age). No filling defects within the common bile duct to suggest choledocholithiasis. However,  there is extensive mural thickening in the proximal to mid gallbladder which demonstrates enhancement on post gadolinium images, concerning for gallbladder neoplasm. No filling defects in the gallbladder to suggest the presence of cholelithiasis. Pancreas: No pancreatic mass. No pancreatic ductal dilatation noted on MRCP images. No pancreatic or peripancreatic fluid collections or inflammatory changes. Spleen:  Unremarkable. Adrenals/Urinary Tract: Subcentimeter T1 hypointense, T2 hyperintense, nonenhancing lesion in the lower pole of the right kidney, compatible with a tiny simple cyst. Left kidney and bilateral adrenal glands are normal in appearance. No hydroureteronephrosis in the visualized portions of the abdomen. Stomach/Bowel: Visualized portions are unremarkable. Vascular/Lymphatic: Aortic atherosclerosis, without evidence of aneurysm in the abdominal vasculature. Mildly enlarged 1.2 cm short axis hepatic duodenal ligament lymph node which demonstrates diffusion restriction (axial image 83 of series 9) and heterogeneous enhancement, concerning for metastatic lymph node. Other: No significant volume of ascites noted in the visualized portions of the peritoneal cavity. Musculoskeletal: No aggressive appearing osseous lesions are noted in the visualized portions of the skeleton. IMPRESSION: 1. Asymmetric mural thickening and enhancement in the gallbladder wall highly concerning for primary gallbladder neoplasm. Multiple malignant appearing lesions noted in the adjacent central aspect of the liver, highly concerning for multifocal hepatic metastasis. In addition, there is an enlarged enhancing hepatic duodenal ligament lymph node which demonstrates diffusion restriction, which likely represents a local nodal metastasis. 2. Cardiomegaly. 3. Trace right pleural effusion. Electronically Signed   By: Vinnie Langton M.D.   On: 09/28/2018 04:24    Labs:  CBC: Recent Labs    09/27/18 1900 09/28/18 0634  09/30/18 0336 10/18/18 1207  WBC 10.4 7.9 10.4 9.7  HGB 12.4 11.9* 11.8* 11.4*  HCT 38.4 38.1 37.1 36.8  PLT 243  253 235 367    COAGS: Recent Labs    10/18/18 1207  INR 0.9    BMP: Recent Labs    09/27/18 1055 09/27/18 1900 09/30/18 0336 10/18/18 1207  NA 143  --  135 140  K 4.1  --  4.4 4.6  CL 104  --  100 106  CO2 27  --  27 22  GLUCOSE 111*  --  119* 97  BUN 17  --  36* 19  CALCIUM 9.1  --  8.5* 8.9  CREATININE 0.82 0.97 1.06* 0.70  GFRNONAA >60 55* 49* >60  GFRAA >60 >60 57* >60    LIVER FUNCTION TESTS: Recent Labs    09/27/18 1055 09/30/18 0336 10/18/18 1207  BILITOT 0.6 0.9 0.5  AST 30 29 39  ALT 34 25 31  ALKPHOS 86 74 148*  PROT 7.5 6.3* 7.5  ALBUMIN 4.0 2.7* 3.4*    TUMOR MARKERS: No results for input(s): AFPTM, CEA, CA199, CHROMGRNA in the last 8760 hours.  Assessment and Plan: 83 y.o. female, [place] speaking, with history of OSA on CPAP, chronic kidney disease, degenerative disc disease, hypertension, osteoporosis who recently had a fall at home with subsequent imaging revealing acute fractures of the right seventh through ninth ribs, bronchiectasis and collapsed right middle lobe lung, annular lesion in the gallbladder body with wall thickening and hyperenhancement, multiple liver lesions concerning for malignancy as well as enlarged and enhancing hepatoduodenal ligament lymph node.  Patient has no prior history of malignancy. She presents today for image guided liver lesion biopsy for further evaluation.Risks and benefits of procedure was discussed with the patient via interpreter including, but not limited to bleeding, infection, damage to adjacent structures or low yield requiring additional tests.  All of the questions were answered and there is agreement to proceed.  Consent signed and in chart.     Thank you for this interesting consult.  I greatly enjoyed meeting [facility] and look forward to participating in their care.  A  copy of this report was sent to the requesting provider on this date.  Electronically Signed: D. Rowe Robert, PA-C 10/18/2018, 1:02 PM   I spent a total of 25 minutes  in face to face in clinical consultation, greater than 50% of which was counseling/coordinating care for image guided liver lesion biopsy

## 2018-10-21 ENCOUNTER — Inpatient Hospital Stay: Payer: Medicare Other | Attending: Oncology | Admitting: Oncology

## 2018-10-21 ENCOUNTER — Telehealth: Payer: Self-pay | Admitting: Oncology

## 2018-10-21 ENCOUNTER — Telehealth: Payer: Self-pay | Admitting: *Deleted

## 2018-10-21 ENCOUNTER — Other Ambulatory Visit: Payer: Self-pay

## 2018-10-21 VITALS — BP 140/61 | HR 92 | Temp 98.0°F | Resp 18 | Ht 67.0 in | Wt 132.7 lb

## 2018-10-21 DIAGNOSIS — C787 Secondary malignant neoplasm of liver and intrahepatic bile duct: Secondary | ICD-10-CM | POA: Diagnosis not present

## 2018-10-21 DIAGNOSIS — W139XXA Fall from, out of or through building, not otherwise specified, initial encounter: Secondary | ICD-10-CM | POA: Insufficient documentation

## 2018-10-21 DIAGNOSIS — I509 Heart failure, unspecified: Secondary | ICD-10-CM | POA: Insufficient documentation

## 2018-10-21 DIAGNOSIS — S2241XA Multiple fractures of ribs, right side, initial encounter for closed fracture: Secondary | ICD-10-CM | POA: Insufficient documentation

## 2018-10-21 DIAGNOSIS — C23 Malignant neoplasm of gallbladder: Secondary | ICD-10-CM | POA: Diagnosis not present

## 2018-10-21 LAB — SURGICAL PATHOLOGY

## 2018-10-21 NOTE — Progress Notes (Signed)
[facility] OFFICE PROGRESS NOTE   Diagnosis: Gallbladder carcinoma  INTERVAL HISTORY:   Natalie Walls returns for scheduled visit.  She is here today with a [place] interpreter.  Her son is not present. She underwent a an ultrasound-guided biopsy of a liver lesion on 10/18/2018.  She reports developing pain in the right upper abdomen, right flank, and right leg following this procedure.  The pain has improved.  Objective:  Vital signs in last 24 hours:  Blood pressure 140/61, pulse 92, temperature 98 F (36.7 C), temperature source Temporal, resp. rate 18, height 5\' 7"  (1.702 m), weight 132 lb 11.2 oz (60.2 kg), SpO2 98 %.    Resp: Inspiratory rhonchi at the lower posterior chest bilaterally Cardio: Regular rate and rhythm GI: There are in the right mid subcostal area, no mass, no hepatomegaly Vascular: No leg edema Musculoskeletal: Tender at the right iliac no mass, no pain with motion of the right hip    Lab Results:  Lab Results  Component Value Date   WBC 9.7 10/18/2018   HGB 11.4 (L) 10/18/2018   HCT 36.8 10/18/2018   MCV 99.2 10/18/2018   PLT 367 10/18/2018   NEUTROABS 5.7 10/18/2018    CMP  Lab Results  Component Value Date   NA 140 10/18/2018   K 4.6 10/18/2018   CL 106 10/18/2018   CO2 22 10/18/2018   GLUCOSE 97 10/18/2018   BUN 19 10/18/2018   CREATININE 0.70 10/18/2018   CALCIUM 8.9 10/18/2018   PROT 7.5 10/18/2018   ALBUMIN 3.4 (L) 10/18/2018   AST 39 10/18/2018   ALT 31 10/18/2018   ALKPHOS 148 (H) 10/18/2018   BILITOT 0.5 10/18/2018   GFRNONAA >60 10/18/2018   GFRAA >60 10/18/2018     Imaging:  US Biopsy (liver)  Result Date: 10/18/2018 INDICATION: Coalescent liver lesions of the central liver just superior to the gallbladder fossa and potentially involving the gallbladder. EXAM: ULTRASOUND GUIDED CORE BIOPSY OF LIVER MEDICATIONS: None. ANESTHESIA/SEDATION: Fentanyl 100 mcg IV; Versed 1.0 mg IV Moderate Sedation Time:  11  minutes. The patient was continuously monitored during the procedure by the interventional radiology nurse under my direct supervision. PROCEDURE: The procedure, risks, benefits, and alternatives were explained to the patient through an interpreter. Questions regarding the procedure were encouraged and answered. The patient understands and consents to the procedure. Ultrasound was used to localize liver lesions. The abdominal wall was prepped with chlorhexidine in a sterile fashion, and a sterile drape was applied covering the operative field. A sterile gown and sterile gloves were used for the procedure. Local anesthesia was provided with 1% Lidocaine. After localizing liver lesions, a 17 gauge trocar needle was advanced to the margin lesions under direct ultrasound guidance. Coaxial 18 gauge core biopsy samples were obtained and submitted in formalin. A total of 3 samples were obtained. After outer needle removal additional ultrasound imaging was performed. COMPLICATIONS: None immediate. FINDINGS: Geographic area of clustered markedly hypoechoic solid masses in the central liver are present over a roughly 5 cm diameter region just superior to the gallbladder. Solid tissue was obtained with biopsy. IMPRESSION: Ultrasound-guided core biopsy performed of clustered solid masses in the central liver over a region measuring approximately 5 cm. Electronically Signed   By: Aletta Edouard M.D.   On: 10/18/2018 16:56    Medications: I have reviewed the patient's current medications.   Assessment/Plan: 1. 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 Fall 09/27/2018 with multiple right-sided rib fractures 3. Pain secondary to #2 4.   CHF 5.   History of a pericardial effusion     Disposition: Natalie Walls has been diagnosed with metastatic adenocarcinoma involving the liver.  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  I discussed the diagnosis and treatment options with Natalie Walls the aid of a [place] interpreter.  She understands no therapy will be curative.  She is not a surgical candidate.  We discussed palliative chemotherapy versus hospice care.  She agrees to a hospice referral.  I attempted to contact her son by telephone several times.  He did not answer.  I will call again later today or tomorrow.  She will be scheduled for an office visit in 3 weeks.  We will make a referral to the home hospice team.  We will ask hospice to come in pain medication and address CODE STATUS with Natalie Walls and her son.   Betsy Coder, MD  10/21/2018  4:01 PM

## 2018-10-21 NOTE — Telephone Encounter (Signed)
Scheduled per 10/05 los, patient received after visit summary and calender.

## 2018-10-21 NOTE — Telephone Encounter (Signed)
Called in referral for Hospice: equipment and pain management are major issues. Son is only person in family that speaks [place]. Code status not discussed today. Dr. Benay Spice will be attending, but hospice MD may assist with symptom management.

## 2018-10-23 DIAGNOSIS — G4733 Obstructive sleep apnea (adult) (pediatric): Secondary | ICD-10-CM | POA: Diagnosis not present

## 2018-10-23 DIAGNOSIS — K429 Umbilical hernia without obstruction or gangrene: Secondary | ICD-10-CM | POA: Diagnosis not present

## 2018-10-23 DIAGNOSIS — Z87448 Personal history of other diseases of urinary system: Secondary | ICD-10-CM | POA: Diagnosis not present

## 2018-10-23 DIAGNOSIS — Z6824 Body mass index (BMI) 24.0-24.9, adult: Secondary | ICD-10-CM | POA: Diagnosis not present

## 2018-10-23 DIAGNOSIS — C787 Secondary malignant neoplasm of liver and intrahepatic bile duct: Secondary | ICD-10-CM | POA: Diagnosis not present

## 2018-10-23 DIAGNOSIS — C23 Malignant neoplasm of gallbladder: Secondary | ICD-10-CM | POA: Diagnosis not present

## 2018-10-23 DIAGNOSIS — G2581 Restless legs syndrome: Secondary | ICD-10-CM | POA: Diagnosis not present

## 2018-10-23 DIAGNOSIS — R296 Repeated falls: Secondary | ICD-10-CM | POA: Diagnosis not present

## 2018-10-23 DIAGNOSIS — I509 Heart failure, unspecified: Secondary | ICD-10-CM | POA: Diagnosis not present

## 2018-10-23 DIAGNOSIS — Z8709 Personal history of other diseases of the respiratory system: Secondary | ICD-10-CM | POA: Diagnosis not present

## 2018-10-23 DIAGNOSIS — M1990 Unspecified osteoarthritis, unspecified site: Secondary | ICD-10-CM | POA: Diagnosis not present

## 2018-10-23 DIAGNOSIS — E785 Hyperlipidemia, unspecified: Secondary | ICD-10-CM | POA: Diagnosis not present

## 2018-10-23 DIAGNOSIS — J45909 Unspecified asthma, uncomplicated: Secondary | ICD-10-CM | POA: Diagnosis not present

## 2018-10-23 DIAGNOSIS — M808AXD Other osteoporosis with current pathological fracture, other site, subsequent encounter for fracture with routine healing: Secondary | ICD-10-CM | POA: Diagnosis not present

## 2018-10-23 DIAGNOSIS — I110 Hypertensive heart disease with heart failure: Secondary | ICD-10-CM | POA: Diagnosis not present

## 2018-10-24 DIAGNOSIS — I110 Hypertensive heart disease with heart failure: Secondary | ICD-10-CM | POA: Diagnosis not present

## 2018-10-24 DIAGNOSIS — C23 Malignant neoplasm of gallbladder: Secondary | ICD-10-CM | POA: Diagnosis not present

## 2018-10-24 DIAGNOSIS — C787 Secondary malignant neoplasm of liver and intrahepatic bile duct: Secondary | ICD-10-CM | POA: Diagnosis not present

## 2018-10-24 DIAGNOSIS — R296 Repeated falls: Secondary | ICD-10-CM | POA: Diagnosis not present

## 2018-10-24 DIAGNOSIS — I509 Heart failure, unspecified: Secondary | ICD-10-CM | POA: Diagnosis not present

## 2018-10-24 DIAGNOSIS — M808AXD Other osteoporosis with current pathological fracture, other site, subsequent encounter for fracture with routine healing: Secondary | ICD-10-CM | POA: Diagnosis not present

## 2018-10-25 DIAGNOSIS — R296 Repeated falls: Secondary | ICD-10-CM | POA: Diagnosis not present

## 2018-10-25 DIAGNOSIS — C23 Malignant neoplasm of gallbladder: Secondary | ICD-10-CM | POA: Diagnosis not present

## 2018-10-25 DIAGNOSIS — M808AXD Other osteoporosis with current pathological fracture, other site, subsequent encounter for fracture with routine healing: Secondary | ICD-10-CM | POA: Diagnosis not present

## 2018-10-25 DIAGNOSIS — C787 Secondary malignant neoplasm of liver and intrahepatic bile duct: Secondary | ICD-10-CM | POA: Diagnosis not present

## 2018-10-25 DIAGNOSIS — I110 Hypertensive heart disease with heart failure: Secondary | ICD-10-CM | POA: Diagnosis not present

## 2018-10-25 DIAGNOSIS — I509 Heart failure, unspecified: Secondary | ICD-10-CM | POA: Diagnosis not present

## 2018-10-28 DIAGNOSIS — R296 Repeated falls: Secondary | ICD-10-CM | POA: Diagnosis not present

## 2018-10-28 DIAGNOSIS — C787 Secondary malignant neoplasm of liver and intrahepatic bile duct: Secondary | ICD-10-CM | POA: Diagnosis not present

## 2018-10-28 DIAGNOSIS — C23 Malignant neoplasm of gallbladder: Secondary | ICD-10-CM | POA: Diagnosis not present

## 2018-10-28 DIAGNOSIS — I509 Heart failure, unspecified: Secondary | ICD-10-CM | POA: Diagnosis not present

## 2018-10-28 DIAGNOSIS — M808AXD Other osteoporosis with current pathological fracture, other site, subsequent encounter for fracture with routine healing: Secondary | ICD-10-CM | POA: Diagnosis not present

## 2018-10-28 DIAGNOSIS — I110 Hypertensive heart disease with heart failure: Secondary | ICD-10-CM | POA: Diagnosis not present

## 2018-10-29 DIAGNOSIS — R296 Repeated falls: Secondary | ICD-10-CM | POA: Diagnosis not present

## 2018-10-29 DIAGNOSIS — C23 Malignant neoplasm of gallbladder: Secondary | ICD-10-CM | POA: Diagnosis not present

## 2018-10-29 DIAGNOSIS — I110 Hypertensive heart disease with heart failure: Secondary | ICD-10-CM | POA: Diagnosis not present

## 2018-10-29 DIAGNOSIS — C787 Secondary malignant neoplasm of liver and intrahepatic bile duct: Secondary | ICD-10-CM | POA: Diagnosis not present

## 2018-10-29 DIAGNOSIS — M808AXD Other osteoporosis with current pathological fracture, other site, subsequent encounter for fracture with routine healing: Secondary | ICD-10-CM | POA: Diagnosis not present

## 2018-10-29 DIAGNOSIS — I509 Heart failure, unspecified: Secondary | ICD-10-CM | POA: Diagnosis not present

## 2018-10-30 DIAGNOSIS — C787 Secondary malignant neoplasm of liver and intrahepatic bile duct: Secondary | ICD-10-CM | POA: Diagnosis not present

## 2018-10-30 DIAGNOSIS — M808AXD Other osteoporosis with current pathological fracture, other site, subsequent encounter for fracture with routine healing: Secondary | ICD-10-CM | POA: Diagnosis not present

## 2018-10-30 DIAGNOSIS — I110 Hypertensive heart disease with heart failure: Secondary | ICD-10-CM | POA: Diagnosis not present

## 2018-10-30 DIAGNOSIS — I509 Heart failure, unspecified: Secondary | ICD-10-CM | POA: Diagnosis not present

## 2018-10-30 DIAGNOSIS — C23 Malignant neoplasm of gallbladder: Secondary | ICD-10-CM | POA: Diagnosis not present

## 2018-10-30 DIAGNOSIS — R296 Repeated falls: Secondary | ICD-10-CM | POA: Diagnosis not present

## 2018-10-31 DIAGNOSIS — R296 Repeated falls: Secondary | ICD-10-CM | POA: Diagnosis not present

## 2018-10-31 DIAGNOSIS — C23 Malignant neoplasm of gallbladder: Secondary | ICD-10-CM | POA: Diagnosis not present

## 2018-10-31 DIAGNOSIS — M808AXD Other osteoporosis with current pathological fracture, other site, subsequent encounter for fracture with routine healing: Secondary | ICD-10-CM | POA: Diagnosis not present

## 2018-10-31 DIAGNOSIS — I509 Heart failure, unspecified: Secondary | ICD-10-CM | POA: Diagnosis not present

## 2018-10-31 DIAGNOSIS — I110 Hypertensive heart disease with heart failure: Secondary | ICD-10-CM | POA: Diagnosis not present

## 2018-10-31 DIAGNOSIS — C787 Secondary malignant neoplasm of liver and intrahepatic bile duct: Secondary | ICD-10-CM | POA: Diagnosis not present

## 2018-11-01 ENCOUNTER — Telehealth: Payer: Self-pay | Admitting: *Deleted

## 2018-11-01 DIAGNOSIS — I110 Hypertensive heart disease with heart failure: Secondary | ICD-10-CM | POA: Diagnosis not present

## 2018-11-01 DIAGNOSIS — G4733 Obstructive sleep apnea (adult) (pediatric): Secondary | ICD-10-CM | POA: Diagnosis not present

## 2018-11-01 DIAGNOSIS — E785 Hyperlipidemia, unspecified: Secondary | ICD-10-CM | POA: Diagnosis not present

## 2018-11-01 DIAGNOSIS — C229 Malignant neoplasm of liver, not specified as primary or secondary: Secondary | ICD-10-CM | POA: Diagnosis not present

## 2018-11-01 DIAGNOSIS — M808AXD Other osteoporosis with current pathological fracture, other site, subsequent encounter for fracture with routine healing: Secondary | ICD-10-CM | POA: Diagnosis not present

## 2018-11-01 DIAGNOSIS — J45909 Unspecified asthma, uncomplicated: Secondary | ICD-10-CM | POA: Diagnosis not present

## 2018-11-01 DIAGNOSIS — I509 Heart failure, unspecified: Secondary | ICD-10-CM | POA: Diagnosis not present

## 2018-11-01 DIAGNOSIS — I10 Essential (primary) hypertension: Secondary | ICD-10-CM | POA: Diagnosis not present

## 2018-11-01 DIAGNOSIS — I313 Pericardial effusion (noninflammatory): Secondary | ICD-10-CM | POA: Diagnosis not present

## 2018-11-01 DIAGNOSIS — C787 Secondary malignant neoplasm of liver and intrahepatic bile duct: Secondary | ICD-10-CM | POA: Diagnosis not present

## 2018-11-01 DIAGNOSIS — M808AXA Other osteoporosis with current pathological fracture, other site, initial encounter for fracture: Secondary | ICD-10-CM | POA: Diagnosis not present

## 2018-11-01 DIAGNOSIS — R296 Repeated falls: Secondary | ICD-10-CM | POA: Diagnosis not present

## 2018-11-01 DIAGNOSIS — Z515 Encounter for palliative care: Secondary | ICD-10-CM | POA: Diagnosis not present

## 2018-11-01 DIAGNOSIS — C221 Intrahepatic bile duct carcinoma: Secondary | ICD-10-CM | POA: Diagnosis not present

## 2018-11-01 DIAGNOSIS — C23 Malignant neoplasm of gallbladder: Secondary | ICD-10-CM | POA: Diagnosis not present

## 2018-11-01 DIAGNOSIS — M810 Age-related osteoporosis without current pathological fracture: Secondary | ICD-10-CM | POA: Diagnosis not present

## 2018-11-01 DIAGNOSIS — G2581 Restless legs syndrome: Secondary | ICD-10-CM | POA: Diagnosis not present

## 2018-11-01 DIAGNOSIS — K429 Umbilical hernia without obstruction or gangrene: Secondary | ICD-10-CM | POA: Diagnosis not present

## 2018-11-01 NOTE — Telephone Encounter (Signed)
Called to report patient is moving to [place] with family and will have her PCP there provide her Hospice needs. Being discharged from [facility] today.

## 2018-11-02 DIAGNOSIS — G2581 Restless legs syndrome: Secondary | ICD-10-CM | POA: Diagnosis not present

## 2018-11-02 DIAGNOSIS — M808AXA Other osteoporosis with current pathological fracture, other site, initial encounter for fracture: Secondary | ICD-10-CM | POA: Diagnosis not present

## 2018-11-02 DIAGNOSIS — C23 Malignant neoplasm of gallbladder: Secondary | ICD-10-CM | POA: Diagnosis not present

## 2018-11-02 DIAGNOSIS — I110 Hypertensive heart disease with heart failure: Secondary | ICD-10-CM | POA: Diagnosis not present

## 2018-11-02 DIAGNOSIS — C221 Intrahepatic bile duct carcinoma: Secondary | ICD-10-CM | POA: Diagnosis not present

## 2018-11-02 DIAGNOSIS — C229 Malignant neoplasm of liver, not specified as primary or secondary: Secondary | ICD-10-CM | POA: Diagnosis not present

## 2018-11-04 DIAGNOSIS — I110 Hypertensive heart disease with heart failure: Secondary | ICD-10-CM | POA: Diagnosis not present

## 2018-11-04 DIAGNOSIS — C23 Malignant neoplasm of gallbladder: Secondary | ICD-10-CM | POA: Diagnosis not present

## 2018-11-04 DIAGNOSIS — G2581 Restless legs syndrome: Secondary | ICD-10-CM | POA: Diagnosis not present

## 2018-11-04 DIAGNOSIS — M808AXA Other osteoporosis with current pathological fracture, other site, initial encounter for fracture: Secondary | ICD-10-CM | POA: Diagnosis not present

## 2018-11-04 DIAGNOSIS — C229 Malignant neoplasm of liver, not specified as primary or secondary: Secondary | ICD-10-CM | POA: Diagnosis not present

## 2018-11-04 DIAGNOSIS — C221 Intrahepatic bile duct carcinoma: Secondary | ICD-10-CM | POA: Diagnosis not present

## 2018-11-05 DIAGNOSIS — C221 Intrahepatic bile duct carcinoma: Secondary | ICD-10-CM | POA: Diagnosis not present

## 2018-11-05 DIAGNOSIS — I110 Hypertensive heart disease with heart failure: Secondary | ICD-10-CM | POA: Diagnosis not present

## 2018-11-05 DIAGNOSIS — C23 Malignant neoplasm of gallbladder: Secondary | ICD-10-CM | POA: Diagnosis not present

## 2018-11-05 DIAGNOSIS — G2581 Restless legs syndrome: Secondary | ICD-10-CM | POA: Diagnosis not present

## 2018-11-05 DIAGNOSIS — C229 Malignant neoplasm of liver, not specified as primary or secondary: Secondary | ICD-10-CM | POA: Diagnosis not present

## 2018-11-05 DIAGNOSIS — M808AXA Other osteoporosis with current pathological fracture, other site, initial encounter for fracture: Secondary | ICD-10-CM | POA: Diagnosis not present

## 2018-11-07 DIAGNOSIS — M808AXA Other osteoporosis with current pathological fracture, other site, initial encounter for fracture: Secondary | ICD-10-CM | POA: Diagnosis not present

## 2018-11-07 DIAGNOSIS — C229 Malignant neoplasm of liver, not specified as primary or secondary: Secondary | ICD-10-CM | POA: Diagnosis not present

## 2018-11-07 DIAGNOSIS — C221 Intrahepatic bile duct carcinoma: Secondary | ICD-10-CM | POA: Diagnosis not present

## 2018-11-07 DIAGNOSIS — C23 Malignant neoplasm of gallbladder: Secondary | ICD-10-CM | POA: Diagnosis not present

## 2018-11-07 DIAGNOSIS — I110 Hypertensive heart disease with heart failure: Secondary | ICD-10-CM | POA: Diagnosis not present

## 2018-11-07 DIAGNOSIS — G2581 Restless legs syndrome: Secondary | ICD-10-CM | POA: Diagnosis not present

## 2018-11-11 ENCOUNTER — Ambulatory Visit: Payer: Medicare Other

## 2018-11-11 ENCOUNTER — Other Ambulatory Visit: Payer: Medicare Other

## 2018-11-12 ENCOUNTER — Ambulatory Visit: Payer: Medicare Other | Admitting: Oncology

## 2018-11-17 DIAGNOSIS — M810 Age-related osteoporosis without current pathological fracture: Secondary | ICD-10-CM | POA: Diagnosis not present

## 2018-11-17 DIAGNOSIS — C229 Malignant neoplasm of liver, not specified as primary or secondary: Secondary | ICD-10-CM | POA: Diagnosis not present

## 2018-11-17 DIAGNOSIS — I10 Essential (primary) hypertension: Secondary | ICD-10-CM | POA: Diagnosis not present

## 2018-11-17 DIAGNOSIS — C23 Malignant neoplasm of gallbladder: Secondary | ICD-10-CM | POA: Diagnosis not present

## 2018-11-17 DIAGNOSIS — C221 Intrahepatic bile duct carcinoma: Secondary | ICD-10-CM | POA: Diagnosis not present

## 2018-11-17 DIAGNOSIS — E785 Hyperlipidemia, unspecified: Secondary | ICD-10-CM | POA: Diagnosis not present

## 2018-11-17 DIAGNOSIS — M808AXA Other osteoporosis with current pathological fracture, other site, initial encounter for fracture: Secondary | ICD-10-CM | POA: Diagnosis not present

## 2018-11-17 DIAGNOSIS — J45909 Unspecified asthma, uncomplicated: Secondary | ICD-10-CM | POA: Diagnosis not present

## 2018-11-17 DIAGNOSIS — G4733 Obstructive sleep apnea (adult) (pediatric): Secondary | ICD-10-CM | POA: Diagnosis not present

## 2018-11-17 DIAGNOSIS — I110 Hypertensive heart disease with heart failure: Secondary | ICD-10-CM | POA: Diagnosis not present

## 2018-11-17 DIAGNOSIS — Z515 Encounter for palliative care: Secondary | ICD-10-CM | POA: Diagnosis not present

## 2018-11-17 DIAGNOSIS — K429 Umbilical hernia without obstruction or gangrene: Secondary | ICD-10-CM | POA: Diagnosis not present

## 2018-11-17 DIAGNOSIS — G2581 Restless legs syndrome: Secondary | ICD-10-CM | POA: Diagnosis not present

## 2018-11-17 DIAGNOSIS — I313 Pericardial effusion (noninflammatory): Secondary | ICD-10-CM | POA: Diagnosis not present

## 2018-11-18 DIAGNOSIS — C221 Intrahepatic bile duct carcinoma: Secondary | ICD-10-CM | POA: Diagnosis not present

## 2018-11-18 DIAGNOSIS — G2581 Restless legs syndrome: Secondary | ICD-10-CM | POA: Diagnosis not present

## 2018-11-18 DIAGNOSIS — M808AXA Other osteoporosis with current pathological fracture, other site, initial encounter for fracture: Secondary | ICD-10-CM | POA: Diagnosis not present

## 2018-11-18 DIAGNOSIS — I110 Hypertensive heart disease with heart failure: Secondary | ICD-10-CM | POA: Diagnosis not present

## 2018-11-18 DIAGNOSIS — C23 Malignant neoplasm of gallbladder: Secondary | ICD-10-CM | POA: Diagnosis not present

## 2018-11-18 DIAGNOSIS — C229 Malignant neoplasm of liver, not specified as primary or secondary: Secondary | ICD-10-CM | POA: Diagnosis not present

## 2018-11-28 DIAGNOSIS — C23 Malignant neoplasm of gallbladder: Secondary | ICD-10-CM | POA: Diagnosis not present

## 2018-11-28 DIAGNOSIS — I110 Hypertensive heart disease with heart failure: Secondary | ICD-10-CM | POA: Diagnosis not present

## 2018-11-28 DIAGNOSIS — C221 Intrahepatic bile duct carcinoma: Secondary | ICD-10-CM | POA: Diagnosis not present

## 2018-11-28 DIAGNOSIS — C229 Malignant neoplasm of liver, not specified as primary or secondary: Secondary | ICD-10-CM | POA: Diagnosis not present

## 2018-11-28 DIAGNOSIS — M808AXA Other osteoporosis with current pathological fracture, other site, initial encounter for fracture: Secondary | ICD-10-CM | POA: Diagnosis not present

## 2018-11-28 DIAGNOSIS — G2581 Restless legs syndrome: Secondary | ICD-10-CM | POA: Diagnosis not present

## 2018-12-06 DIAGNOSIS — C23 Malignant neoplasm of gallbladder: Secondary | ICD-10-CM | POA: Diagnosis not present

## 2018-12-06 DIAGNOSIS — G2581 Restless legs syndrome: Secondary | ICD-10-CM | POA: Diagnosis not present

## 2018-12-06 DIAGNOSIS — C221 Intrahepatic bile duct carcinoma: Secondary | ICD-10-CM | POA: Diagnosis not present

## 2018-12-06 DIAGNOSIS — I110 Hypertensive heart disease with heart failure: Secondary | ICD-10-CM | POA: Diagnosis not present

## 2018-12-06 DIAGNOSIS — C229 Malignant neoplasm of liver, not specified as primary or secondary: Secondary | ICD-10-CM | POA: Diagnosis not present

## 2018-12-06 DIAGNOSIS — M808AXA Other osteoporosis with current pathological fracture, other site, initial encounter for fracture: Secondary | ICD-10-CM | POA: Diagnosis not present

## 2018-12-09 DIAGNOSIS — C23 Malignant neoplasm of gallbladder: Secondary | ICD-10-CM | POA: Diagnosis not present

## 2018-12-09 DIAGNOSIS — M808AXA Other osteoporosis with current pathological fracture, other site, initial encounter for fracture: Secondary | ICD-10-CM | POA: Diagnosis not present

## 2018-12-09 DIAGNOSIS — I110 Hypertensive heart disease with heart failure: Secondary | ICD-10-CM | POA: Diagnosis not present

## 2018-12-09 DIAGNOSIS — C229 Malignant neoplasm of liver, not specified as primary or secondary: Secondary | ICD-10-CM | POA: Diagnosis not present

## 2018-12-09 DIAGNOSIS — G2581 Restless legs syndrome: Secondary | ICD-10-CM | POA: Diagnosis not present

## 2018-12-09 DIAGNOSIS — C221 Intrahepatic bile duct carcinoma: Secondary | ICD-10-CM | POA: Diagnosis not present

## 2018-12-24 ENCOUNTER — Ambulatory Visit: Payer: Medicare Other | Admitting: Internal Medicine

## 2019-01-17 DIAGNOSIS — G4733 Obstructive sleep apnea (adult) (pediatric): Secondary | ICD-10-CM | POA: Diagnosis not present

## 2019-01-17 DIAGNOSIS — M808AXA Other osteoporosis with current pathological fracture, other site, initial encounter for fracture: Secondary | ICD-10-CM | POA: Diagnosis not present

## 2019-01-17 DIAGNOSIS — J45909 Unspecified asthma, uncomplicated: Secondary | ICD-10-CM | POA: Diagnosis not present

## 2019-01-17 DIAGNOSIS — C221 Intrahepatic bile duct carcinoma: Secondary | ICD-10-CM | POA: Diagnosis not present

## 2019-01-17 DIAGNOSIS — C229 Malignant neoplasm of liver, not specified as primary or secondary: Secondary | ICD-10-CM | POA: Diagnosis not present

## 2019-01-17 DIAGNOSIS — I10 Essential (primary) hypertension: Secondary | ICD-10-CM | POA: Diagnosis not present

## 2019-01-17 DIAGNOSIS — I110 Hypertensive heart disease with heart failure: Secondary | ICD-10-CM | POA: Diagnosis not present

## 2019-01-17 DIAGNOSIS — C23 Malignant neoplasm of gallbladder: Secondary | ICD-10-CM | POA: Diagnosis not present

## 2019-01-17 DIAGNOSIS — I313 Pericardial effusion (noninflammatory): Secondary | ICD-10-CM | POA: Diagnosis not present

## 2019-01-17 DIAGNOSIS — G2581 Restless legs syndrome: Secondary | ICD-10-CM | POA: Diagnosis not present

## 2019-01-17 DIAGNOSIS — Z515 Encounter for palliative care: Secondary | ICD-10-CM | POA: Diagnosis not present

## 2019-01-20 DIAGNOSIS — C229 Malignant neoplasm of liver, not specified as primary or secondary: Secondary | ICD-10-CM | POA: Diagnosis not present

## 2019-01-20 DIAGNOSIS — C221 Intrahepatic bile duct carcinoma: Secondary | ICD-10-CM | POA: Diagnosis not present

## 2019-01-20 DIAGNOSIS — I110 Hypertensive heart disease with heart failure: Secondary | ICD-10-CM | POA: Diagnosis not present

## 2019-01-20 DIAGNOSIS — M808AXA Other osteoporosis with current pathological fracture, other site, initial encounter for fracture: Secondary | ICD-10-CM | POA: Diagnosis not present

## 2019-01-20 DIAGNOSIS — C23 Malignant neoplasm of gallbladder: Secondary | ICD-10-CM | POA: Diagnosis not present

## 2019-01-20 DIAGNOSIS — G2581 Restless legs syndrome: Secondary | ICD-10-CM | POA: Diagnosis not present

## 2019-01-21 DIAGNOSIS — G2581 Restless legs syndrome: Secondary | ICD-10-CM | POA: Diagnosis not present

## 2019-01-21 DIAGNOSIS — C221 Intrahepatic bile duct carcinoma: Secondary | ICD-10-CM | POA: Diagnosis not present

## 2019-01-21 DIAGNOSIS — C229 Malignant neoplasm of liver, not specified as primary or secondary: Secondary | ICD-10-CM | POA: Diagnosis not present

## 2019-01-21 DIAGNOSIS — I110 Hypertensive heart disease with heart failure: Secondary | ICD-10-CM | POA: Diagnosis not present

## 2019-01-21 DIAGNOSIS — M808AXA Other osteoporosis with current pathological fracture, other site, initial encounter for fracture: Secondary | ICD-10-CM | POA: Diagnosis not present

## 2019-01-21 DIAGNOSIS — C23 Malignant neoplasm of gallbladder: Secondary | ICD-10-CM | POA: Diagnosis not present

## 2019-01-22 DIAGNOSIS — C229 Malignant neoplasm of liver, not specified as primary or secondary: Secondary | ICD-10-CM | POA: Diagnosis not present

## 2019-01-22 DIAGNOSIS — I110 Hypertensive heart disease with heart failure: Secondary | ICD-10-CM | POA: Diagnosis not present

## 2019-01-22 DIAGNOSIS — C221 Intrahepatic bile duct carcinoma: Secondary | ICD-10-CM | POA: Diagnosis not present

## 2019-01-22 DIAGNOSIS — C23 Malignant neoplasm of gallbladder: Secondary | ICD-10-CM | POA: Diagnosis not present

## 2019-01-22 DIAGNOSIS — G2581 Restless legs syndrome: Secondary | ICD-10-CM | POA: Diagnosis not present

## 2019-01-22 DIAGNOSIS — M808AXA Other osteoporosis with current pathological fracture, other site, initial encounter for fracture: Secondary | ICD-10-CM | POA: Diagnosis not present

## 2019-01-29 DIAGNOSIS — G2581 Restless legs syndrome: Secondary | ICD-10-CM | POA: Diagnosis not present

## 2019-01-29 DIAGNOSIS — C221 Intrahepatic bile duct carcinoma: Secondary | ICD-10-CM | POA: Diagnosis not present

## 2019-01-29 DIAGNOSIS — C229 Malignant neoplasm of liver, not specified as primary or secondary: Secondary | ICD-10-CM | POA: Diagnosis not present

## 2019-01-29 DIAGNOSIS — C23 Malignant neoplasm of gallbladder: Secondary | ICD-10-CM | POA: Diagnosis not present

## 2019-01-29 DIAGNOSIS — M808AXA Other osteoporosis with current pathological fracture, other site, initial encounter for fracture: Secondary | ICD-10-CM | POA: Diagnosis not present

## 2019-01-29 DIAGNOSIS — I110 Hypertensive heart disease with heart failure: Secondary | ICD-10-CM | POA: Diagnosis not present

## 2019-02-06 DIAGNOSIS — I110 Hypertensive heart disease with heart failure: Secondary | ICD-10-CM | POA: Diagnosis not present

## 2019-02-06 DIAGNOSIS — G2581 Restless legs syndrome: Secondary | ICD-10-CM | POA: Diagnosis not present

## 2019-02-06 DIAGNOSIS — C23 Malignant neoplasm of gallbladder: Secondary | ICD-10-CM | POA: Diagnosis not present

## 2019-02-06 DIAGNOSIS — C221 Intrahepatic bile duct carcinoma: Secondary | ICD-10-CM | POA: Diagnosis not present

## 2019-02-06 DIAGNOSIS — M808AXA Other osteoporosis with current pathological fracture, other site, initial encounter for fracture: Secondary | ICD-10-CM | POA: Diagnosis not present

## 2019-02-06 DIAGNOSIS — C229 Malignant neoplasm of liver, not specified as primary or secondary: Secondary | ICD-10-CM | POA: Diagnosis not present

## 2019-02-11 DIAGNOSIS — C221 Intrahepatic bile duct carcinoma: Secondary | ICD-10-CM | POA: Diagnosis not present

## 2019-02-11 DIAGNOSIS — M808AXA Other osteoporosis with current pathological fracture, other site, initial encounter for fracture: Secondary | ICD-10-CM | POA: Diagnosis not present

## 2019-02-11 DIAGNOSIS — G2581 Restless legs syndrome: Secondary | ICD-10-CM | POA: Diagnosis not present

## 2019-02-11 DIAGNOSIS — C23 Malignant neoplasm of gallbladder: Secondary | ICD-10-CM | POA: Diagnosis not present

## 2019-02-11 DIAGNOSIS — I110 Hypertensive heart disease with heart failure: Secondary | ICD-10-CM | POA: Diagnosis not present

## 2019-02-11 DIAGNOSIS — C229 Malignant neoplasm of liver, not specified as primary or secondary: Secondary | ICD-10-CM | POA: Diagnosis not present

## 2019-02-13 DIAGNOSIS — I110 Hypertensive heart disease with heart failure: Secondary | ICD-10-CM | POA: Diagnosis not present

## 2019-02-13 DIAGNOSIS — C23 Malignant neoplasm of gallbladder: Secondary | ICD-10-CM | POA: Diagnosis not present

## 2019-02-13 DIAGNOSIS — G2581 Restless legs syndrome: Secondary | ICD-10-CM | POA: Diagnosis not present

## 2019-02-13 DIAGNOSIS — C221 Intrahepatic bile duct carcinoma: Secondary | ICD-10-CM | POA: Diagnosis not present

## 2019-02-13 DIAGNOSIS — M808AXA Other osteoporosis with current pathological fracture, other site, initial encounter for fracture: Secondary | ICD-10-CM | POA: Diagnosis not present

## 2019-02-13 DIAGNOSIS — C229 Malignant neoplasm of liver, not specified as primary or secondary: Secondary | ICD-10-CM | POA: Diagnosis not present

## 2019-02-17 DIAGNOSIS — M808AXA Other osteoporosis with current pathological fracture, other site, initial encounter for fracture: Secondary | ICD-10-CM | POA: Diagnosis not present

## 2019-02-17 DIAGNOSIS — I313 Pericardial effusion (noninflammatory): Secondary | ICD-10-CM | POA: Diagnosis not present

## 2019-02-17 DIAGNOSIS — G2581 Restless legs syndrome: Secondary | ICD-10-CM | POA: Diagnosis not present

## 2019-02-17 DIAGNOSIS — I10 Essential (primary) hypertension: Secondary | ICD-10-CM | POA: Diagnosis not present

## 2019-02-17 DIAGNOSIS — C23 Malignant neoplasm of gallbladder: Secondary | ICD-10-CM | POA: Diagnosis not present

## 2019-02-17 DIAGNOSIS — Z515 Encounter for palliative care: Secondary | ICD-10-CM | POA: Diagnosis not present

## 2019-02-17 DIAGNOSIS — J45909 Unspecified asthma, uncomplicated: Secondary | ICD-10-CM | POA: Diagnosis not present

## 2019-02-17 DIAGNOSIS — C221 Intrahepatic bile duct carcinoma: Secondary | ICD-10-CM | POA: Diagnosis not present

## 2019-02-17 DIAGNOSIS — C229 Malignant neoplasm of liver, not specified as primary or secondary: Secondary | ICD-10-CM | POA: Diagnosis not present

## 2019-02-17 DIAGNOSIS — G4733 Obstructive sleep apnea (adult) (pediatric): Secondary | ICD-10-CM | POA: Diagnosis not present

## 2019-02-17 DIAGNOSIS — I110 Hypertensive heart disease with heart failure: Secondary | ICD-10-CM | POA: Diagnosis not present

## 2019-02-26 DIAGNOSIS — C23 Malignant neoplasm of gallbladder: Secondary | ICD-10-CM | POA: Diagnosis not present

## 2019-02-26 DIAGNOSIS — I110 Hypertensive heart disease with heart failure: Secondary | ICD-10-CM | POA: Diagnosis not present

## 2019-02-26 DIAGNOSIS — G2581 Restless legs syndrome: Secondary | ICD-10-CM | POA: Diagnosis not present

## 2019-02-26 DIAGNOSIS — C229 Malignant neoplasm of liver, not specified as primary or secondary: Secondary | ICD-10-CM | POA: Diagnosis not present

## 2019-02-26 DIAGNOSIS — C221 Intrahepatic bile duct carcinoma: Secondary | ICD-10-CM | POA: Diagnosis not present

## 2019-02-26 DIAGNOSIS — M808AXA Other osteoporosis with current pathological fracture, other site, initial encounter for fracture: Secondary | ICD-10-CM | POA: Diagnosis not present

## 2019-02-27 DIAGNOSIS — C221 Intrahepatic bile duct carcinoma: Secondary | ICD-10-CM | POA: Diagnosis not present

## 2019-02-27 DIAGNOSIS — C23 Malignant neoplasm of gallbladder: Secondary | ICD-10-CM | POA: Diagnosis not present

## 2019-02-27 DIAGNOSIS — I110 Hypertensive heart disease with heart failure: Secondary | ICD-10-CM | POA: Diagnosis not present

## 2019-02-27 DIAGNOSIS — M808AXA Other osteoporosis with current pathological fracture, other site, initial encounter for fracture: Secondary | ICD-10-CM | POA: Diagnosis not present

## 2019-02-27 DIAGNOSIS — C229 Malignant neoplasm of liver, not specified as primary or secondary: Secondary | ICD-10-CM | POA: Diagnosis not present

## 2019-02-27 DIAGNOSIS — G2581 Restless legs syndrome: Secondary | ICD-10-CM | POA: Diagnosis not present

## 2019-03-17 DEATH — deceased

## 2019-10-11 IMAGING — MR MR ABDOMEN WO/W CM MRCP
18 of 21 series · 45 of 48 positions shown · IV contrast (gadavist)
Comparison: No prior abdominal MRI. CT the abdomen and pelvis
09/27/2018.

CLINICAL DATA: 83-year-old female with history of right-sided
abdominal pain after a fall. Abnormal CT demonstrating potential
gallbladder mass. Follow-up study.

EXAM:
MRI ABDOMEN WITHOUT AND WITH CONTRAST (INCLUDING MRCP)
TECHNIQUE: Multiplanar multisequence MR imaging of the abdomen was performed
both before and after the administration of intravenous contrast.
Heavily T2-weighted images of the biliary and pancreatic ducts were
obtained, and three-dimensional MRCP images were rendered by post
processing.
CONTRAST:  6mL GADAVIST GADOBUTROL 1 MMOL/ML IV SOLN

[Series 4: ax haste · axial · 6.0mm · 1.19mm/px · 1 of 30 slices shown]
[im 1/30]
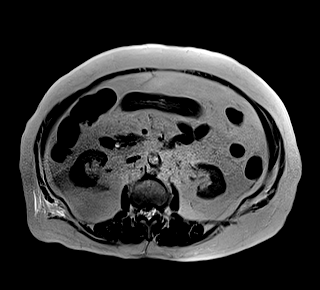

[Series 5: bSSFP · coronal · 6.0mm · 0.74mm/px · 1 of 30 slices shown]
[im 1/30]
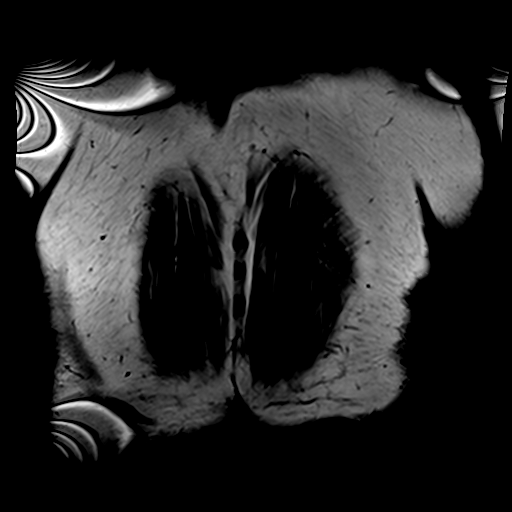

[Series 8: T2 fat-sat · axial · 6.0mm · 1.19mm/px · 1 of 30 slices shown]
[im 1/30]
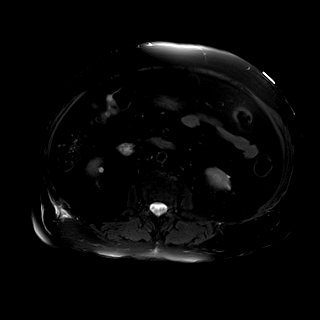

[Series 9: DWI · axial · 6.0mm · 1.42mm/px · z∈[+46,+255]mm · 2 of 90 slices shown (1 of 2)]
[im 1/90]
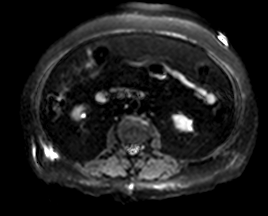
[im 90/90]
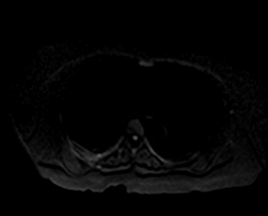

[Series 10: DWI · axial · 6.0mm · 1.42mm/px · 1 of 30 slices shown (2 of 2)]
[im 1/30]
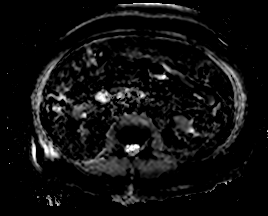

[Series 11: ax in and · axial · 3.0mm · 1.19mm/px · z∈[+44,+257]mm · 5 of 144 slices shown]
[im 1/144]
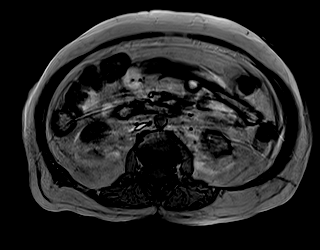
[im 36/144]
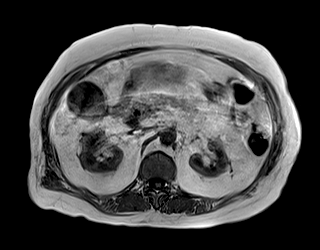
[im 72/144]
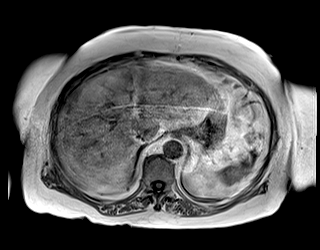
[im 108/144]
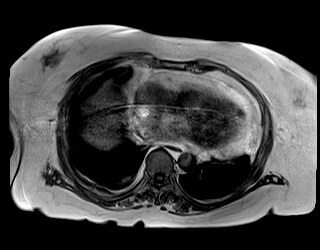
[im 144/144]
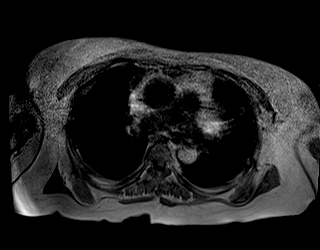

[Series 16: MRCP · coronal · 4.0mm · 1.12mm/px · 1 of 15 slices shown]
[im 1/15]
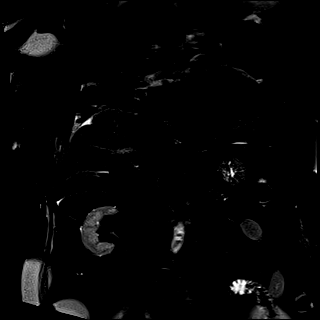

[Series 18: T1 dynamic · axial · non-contrast · 3.0mm · 1.19mm/px · z∈[+29,+242]mm · 3 of 72 slices shown (1 of 5)]
[im 1/72]
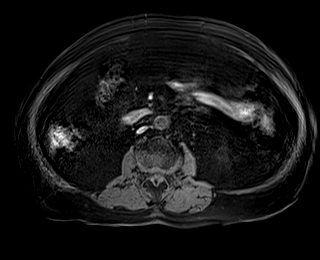
[im 36/72]
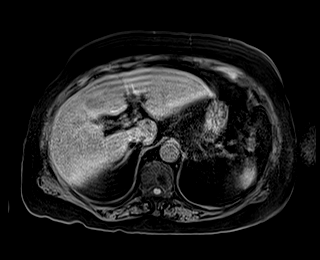
[im 72/72]
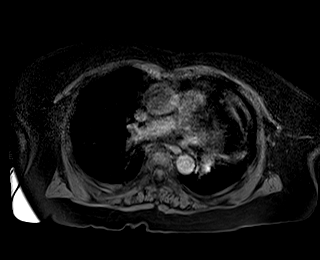

[Series 20: T1 dynamic post-contrast · axial · 3.0mm · 1.19mm/px · z∈[+29,+242]mm · 3 of 72 slices shown (1 of 6)]
[im 1/72]
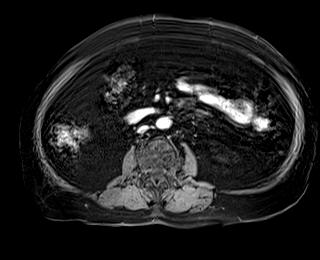
[im 36/72]
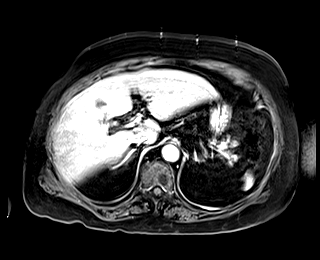
[im 72/72]
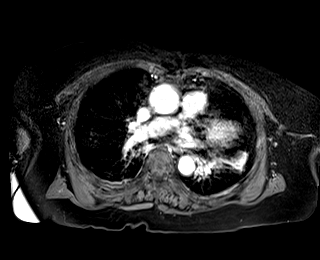

[Series 21: T1 dynamic · axial · 3.0mm · 1.19mm/px · z∈[+29,+242]mm · 3 of 72 slices shown (2 of 5)]
[im 1/72]
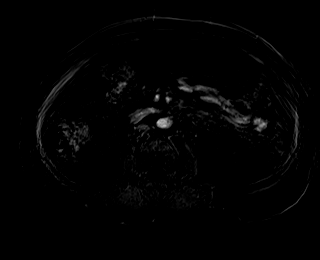
[im 36/72]
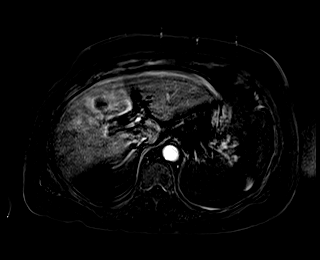
[im 72/72]
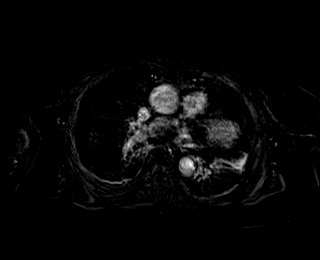

[Series 22: T1 dynamic post-contrast · axial · 3.0mm · 1.19mm/px · z∈[+29,+242]mm · 3 of 72 slices shown (2 of 6)]
[im 1/72]
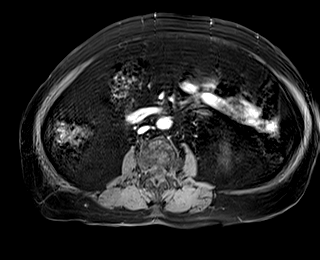
[im 36/72]
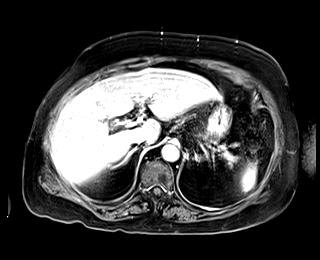
[im 72/72]
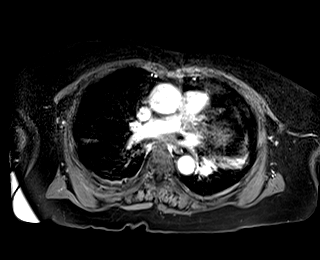

[Series 23: T1 dynamic · axial · 3.0mm · 1.19mm/px · z∈[+29,+242]mm · 3 of 72 slices shown (3 of 5)]
[im 1/72]
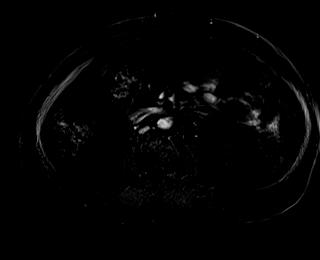
[im 36/72]
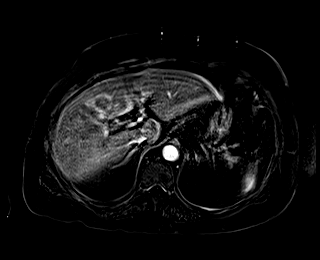
[im 72/72]
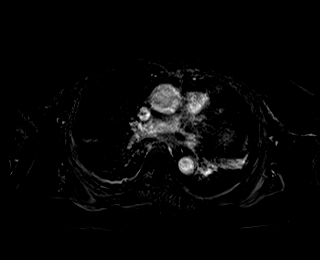

[Series 24: T1 dynamic post-contrast · axial · 3.0mm · 1.19mm/px · z∈[+29,+242]mm · 3 of 72 slices shown (3 of 6)]
[im 1/72]
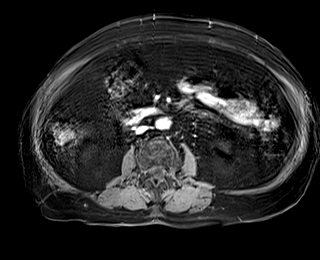
[im 36/72]
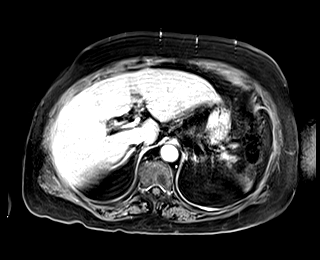
[im 72/72]
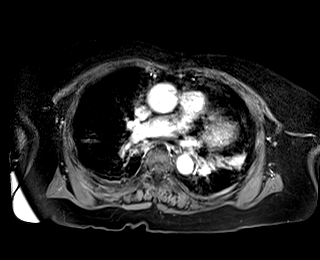

[Series 25: T1 dynamic · axial · 3.0mm · 1.19mm/px · z∈[+29,+242]mm · 3 of 72 slices shown (4 of 5)]
[im 1/72]
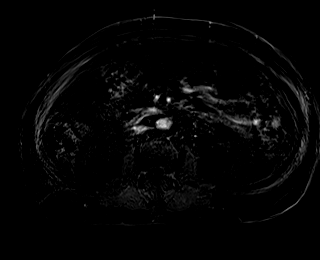
[im 36/72]
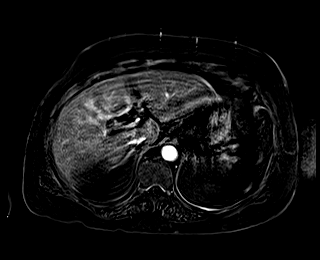
[im 72/72]
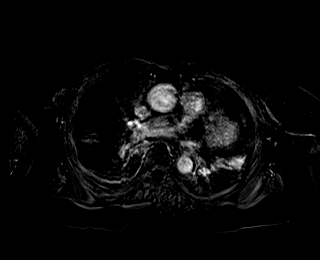

[Series 26: T1 dynamic post-contrast · axial · 3.0mm · 1.19mm/px · z∈[+29,+242]mm · 3 of 72 slices shown (4 of 6)]
[im 1/72]
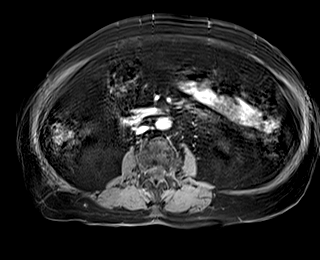
[im 36/72]
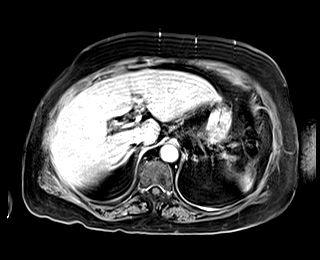
[im 72/72]
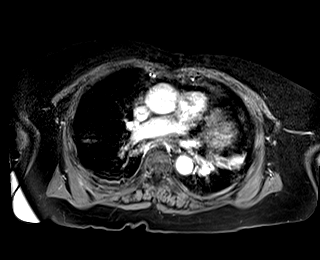

[Series 27: T1 dynamic · axial · 3.0mm · 1.19mm/px · z∈[+29,+242]mm · 3 of 72 slices shown (5 of 5)]
[im 1/72]
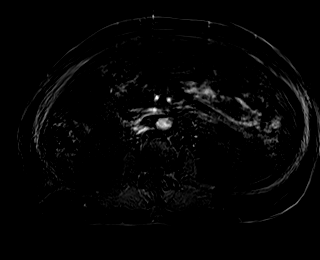
[im 36/72]
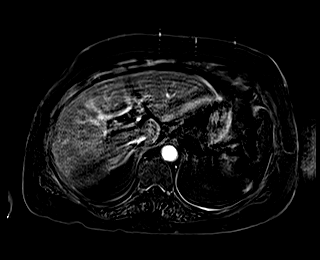
[im 72/72]
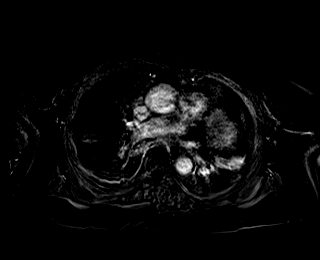

[Series 28: T1 dynamic post-contrast · coronal · 3.0mm · 1.31mm/px · 3 of 72 slices shown (5 of 6)]
[im 1/72]
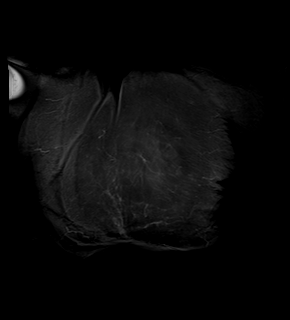
[im 36/72]
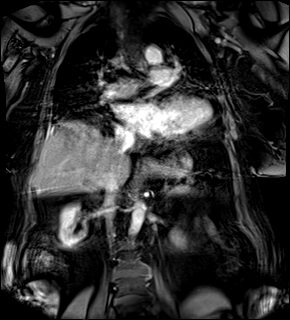
[im 72/72]
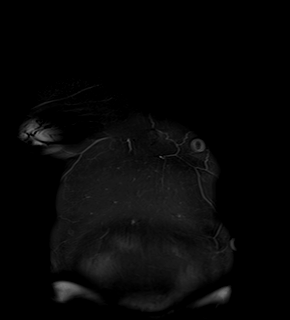

[Series 29: T1 dynamic post-contrast · coronal · 3.0mm · 1.31mm/px · 3 of 72 slices shown (6 of 6)]
[im 1/72]
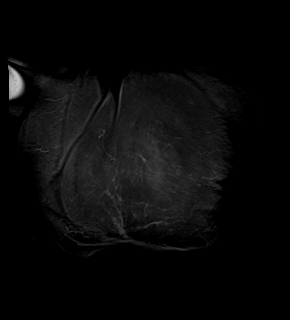
[im 36/72]
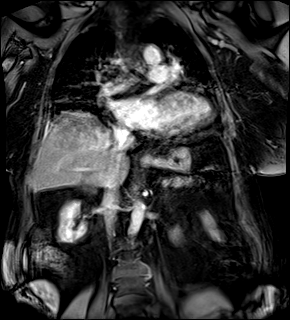
[im 72/72]
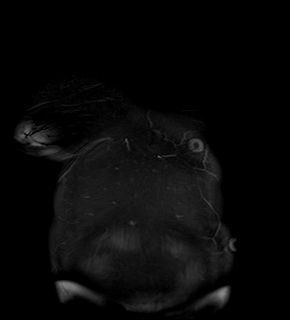

[45 of 48 positions shown; findings below may reference images not displayed]

FINDINGS: Comment: Portions of today's examination are limited by considerable
patient respiratory motion.

Lower chest: Cardiomegaly. Trace right pleural effusion lying
dependently.

Hepatobiliary: Diffuse loss of signal intensity throughout the
hepatic parenchyma on out of phase dual echo images, indicative of
hepatic steatosis. Within the central aspect of the liver
predominantly in segments 4A/B, segment 5 and segment 8 there are
multiple T1 I so to slightly hypointense, T2 hyperintense, centrally
hypovascular peripherally enhancing lesions which demonstrate
diffusion restriction. The largest of these measures up to 2.2 cm
(axial image 18 of series 8). No intra or extrahepatic biliary
ductal dilatation noted on MRCP images. Common bile duct measures 7
mm in the porta hepatis (within normal limits for the patient's
age). No filling defects within the common bile duct to suggest
choledocholithiasis. However, there is extensive mural thickening in
the proximal to mid gallbladder which demonstrates enhancement on
post gadolinium images, concerning for gallbladder neoplasm. No
filling defects in the gallbladder to suggest the presence of
cholelithiasis.

Pancreas: No pancreatic mass. No pancreatic ductal dilatation noted
on MRCP images. No pancreatic or peripancreatic fluid collections or
inflammatory changes.

Spleen:  Unremarkable.

Adrenals/Urinary Tract: Subcentimeter T1 hypointense, T2
hyperintense, nonenhancing lesion in the lower pole of the right
kidney, compatible with a tiny simple cyst. Left kidney and
bilateral adrenal glands are normal in appearance. No
hydroureteronephrosis in the visualized portions of the abdomen.

Stomach/Bowel: Visualized portions are unremarkable.

Vascular/Lymphatic: Aortic atherosclerosis, without evidence of
aneurysm in the abdominal vasculature. Mildly enlarged 1.2 cm short
axis hepatic duodenal ligament lymph node which demonstrates
diffusion restriction (axial image 83 of series 9) and heterogeneous
enhancement, concerning for metastatic lymph node.

Other: No significant volume of ascites noted in the visualized
portions of the peritoneal cavity.

Musculoskeletal: No aggressive appearing osseous lesions are noted
in the visualized portions of the skeleton.
IMPRESSION: 1. Asymmetric mural thickening and enhancement in the gallbladder
wall highly concerning for primary gallbladder neoplasm. Multiple
malignant appearing lesions noted in the adjacent central aspect of
the liver, highly concerning for multifocal hepatic metastasis. In
addition, there is an enlarged enhancing hepatic duodenal ligament
lymph node which demonstrates diffusion restriction, which likely
represents a local nodal metastasis.
2. Cardiomegaly.
3. Trace right pleural effusion.
